# Patient Record
Sex: Female | Born: 1940
Health system: Southern US, Community
[De-identification: ages and names within clinical notes are randomized; demographics above are authoritative.]

## PROBLEM LIST (undated history)

## (undated) DIAGNOSIS — F3289 Other specified depressive episodes: Secondary | ICD-10-CM

## (undated) DIAGNOSIS — E119 Type 2 diabetes mellitus without complications: Secondary | ICD-10-CM

## (undated) DIAGNOSIS — I313 Pericardial effusion (noninflammatory): Principal | ICD-10-CM

## (undated) DIAGNOSIS — F329 Major depressive disorder, single episode, unspecified: Secondary | ICD-10-CM

## (undated) DIAGNOSIS — E785 Hyperlipidemia, unspecified: Secondary | ICD-10-CM

## (undated) DIAGNOSIS — I10 Essential (primary) hypertension: Secondary | ICD-10-CM

## (undated) DIAGNOSIS — E669 Obesity, unspecified: Secondary | ICD-10-CM

## (undated) HISTORY — DX: Essential (primary) hypertension: I10

## (undated) HISTORY — DX: Other specified depressive episodes: F32.89

## (undated) HISTORY — PX: BREAST BIOPSY: SHX20

## (undated) HISTORY — DX: Type 2 diabetes mellitus without complications: E11.9

## (undated) HISTORY — DX: Obesity, unspecified: E66.9

## (undated) HISTORY — DX: Major depressive disorder, single episode, unspecified: F32.9

## (undated) HISTORY — DX: Pericardial effusion (noninflammatory): I31.3

## (undated) HISTORY — DX: Hyperlipidemia, unspecified: E78.5

## (undated) HISTORY — PX: KNEE ARTHROSCOPY: SUR90

## (undated) HISTORY — PX: ABDOMINAL HYSTERECTOMY: SHX81

---

## 1999-11-02 ENCOUNTER — Encounter: Admission: RE | Admit: 1999-11-02 | Discharge: 1999-11-02 | Payer: Self-pay | Admitting: *Deleted

## 2000-11-02 ENCOUNTER — Encounter: Admission: RE | Admit: 2000-11-02 | Discharge: 2000-11-02 | Payer: Self-pay | Admitting: *Deleted

## 2001-11-04 ENCOUNTER — Encounter: Admission: RE | Admit: 2001-11-04 | Discharge: 2001-11-04 | Payer: Self-pay | Admitting: *Deleted

## 2002-06-25 ENCOUNTER — Encounter: Admission: RE | Admit: 2002-06-25 | Discharge: 2002-07-22 | Payer: Self-pay | Admitting: Internal Medicine

## 2002-08-04 ENCOUNTER — Encounter: Admission: RE | Admit: 2002-08-04 | Discharge: 2002-11-02 | Payer: Self-pay | Admitting: Internal Medicine

## 2002-11-05 ENCOUNTER — Encounter: Payer: Self-pay | Admitting: Internal Medicine

## 2002-11-05 ENCOUNTER — Encounter: Admission: RE | Admit: 2002-11-05 | Discharge: 2002-11-05 | Payer: Self-pay | Admitting: Internal Medicine

## 2003-11-09 ENCOUNTER — Encounter: Admission: RE | Admit: 2003-11-09 | Discharge: 2003-11-09 | Payer: Self-pay | Admitting: Internal Medicine

## 2004-11-11 ENCOUNTER — Encounter: Admission: RE | Admit: 2004-11-11 | Discharge: 2004-11-11 | Payer: Self-pay | Admitting: Internal Medicine

## 2004-11-29 ENCOUNTER — Encounter: Admission: RE | Admit: 2004-11-29 | Discharge: 2004-11-29 | Payer: Self-pay | Admitting: Internal Medicine

## 2004-11-30 ENCOUNTER — Encounter: Admission: RE | Admit: 2004-11-30 | Discharge: 2004-11-30 | Payer: Self-pay | Admitting: Internal Medicine

## 2004-11-30 ENCOUNTER — Encounter (INDEPENDENT_AMBULATORY_CARE_PROVIDER_SITE_OTHER): Payer: Self-pay | Admitting: *Deleted

## 2005-05-26 ENCOUNTER — Encounter: Admission: RE | Admit: 2005-05-26 | Discharge: 2005-05-26 | Payer: Self-pay | Admitting: Internal Medicine

## 2005-11-14 ENCOUNTER — Encounter: Admission: RE | Admit: 2005-11-14 | Discharge: 2005-11-14 | Payer: Self-pay | Admitting: Internal Medicine

## 2006-11-15 ENCOUNTER — Encounter: Admission: RE | Admit: 2006-11-15 | Discharge: 2006-11-15 | Payer: Self-pay | Admitting: Internal Medicine

## 2007-11-18 ENCOUNTER — Encounter: Admission: RE | Admit: 2007-11-18 | Discharge: 2007-11-18 | Payer: Self-pay | Admitting: Internal Medicine

## 2008-09-01 ENCOUNTER — Encounter: Admission: RE | Admit: 2008-09-01 | Discharge: 2008-09-01 | Payer: Self-pay | Admitting: Internal Medicine

## 2008-10-09 ENCOUNTER — Inpatient Hospital Stay (HOSPITAL_COMMUNITY): Admission: EM | Admit: 2008-10-09 | Discharge: 2008-10-10 | Payer: Self-pay | Admitting: Emergency Medicine

## 2008-10-12 ENCOUNTER — Observation Stay (HOSPITAL_COMMUNITY): Admission: EM | Admit: 2008-10-12 | Discharge: 2008-10-14 | Payer: Self-pay | Admitting: Emergency Medicine

## 2008-10-13 ENCOUNTER — Ambulatory Visit: Payer: Self-pay | Admitting: Surgery

## 2008-10-13 ENCOUNTER — Encounter (INDEPENDENT_AMBULATORY_CARE_PROVIDER_SITE_OTHER): Payer: Self-pay | Admitting: Internal Medicine

## 2008-11-18 ENCOUNTER — Encounter: Admission: RE | Admit: 2008-11-18 | Discharge: 2008-11-18 | Payer: Self-pay | Admitting: Internal Medicine

## 2008-11-18 ENCOUNTER — Ambulatory Visit: Payer: Self-pay | Admitting: Vascular Surgery

## 2009-02-14 IMAGING — MG MM SCREEN MAMMOGRAM BILATERAL
4 series · 4 of 4 positions shown · non-contrast
Comparison: none

DG SCREEN MAMMOGRAM BILATERAL
Bilateral CC and MLO view(s) were taken.

DIGITAL SCREENING MAMMOGRAM WITH CAD:
There are scattered fibroglandular densities.  No masses or malignant type calcifications are 
identified.  Compared with prior studies.

[R CC]
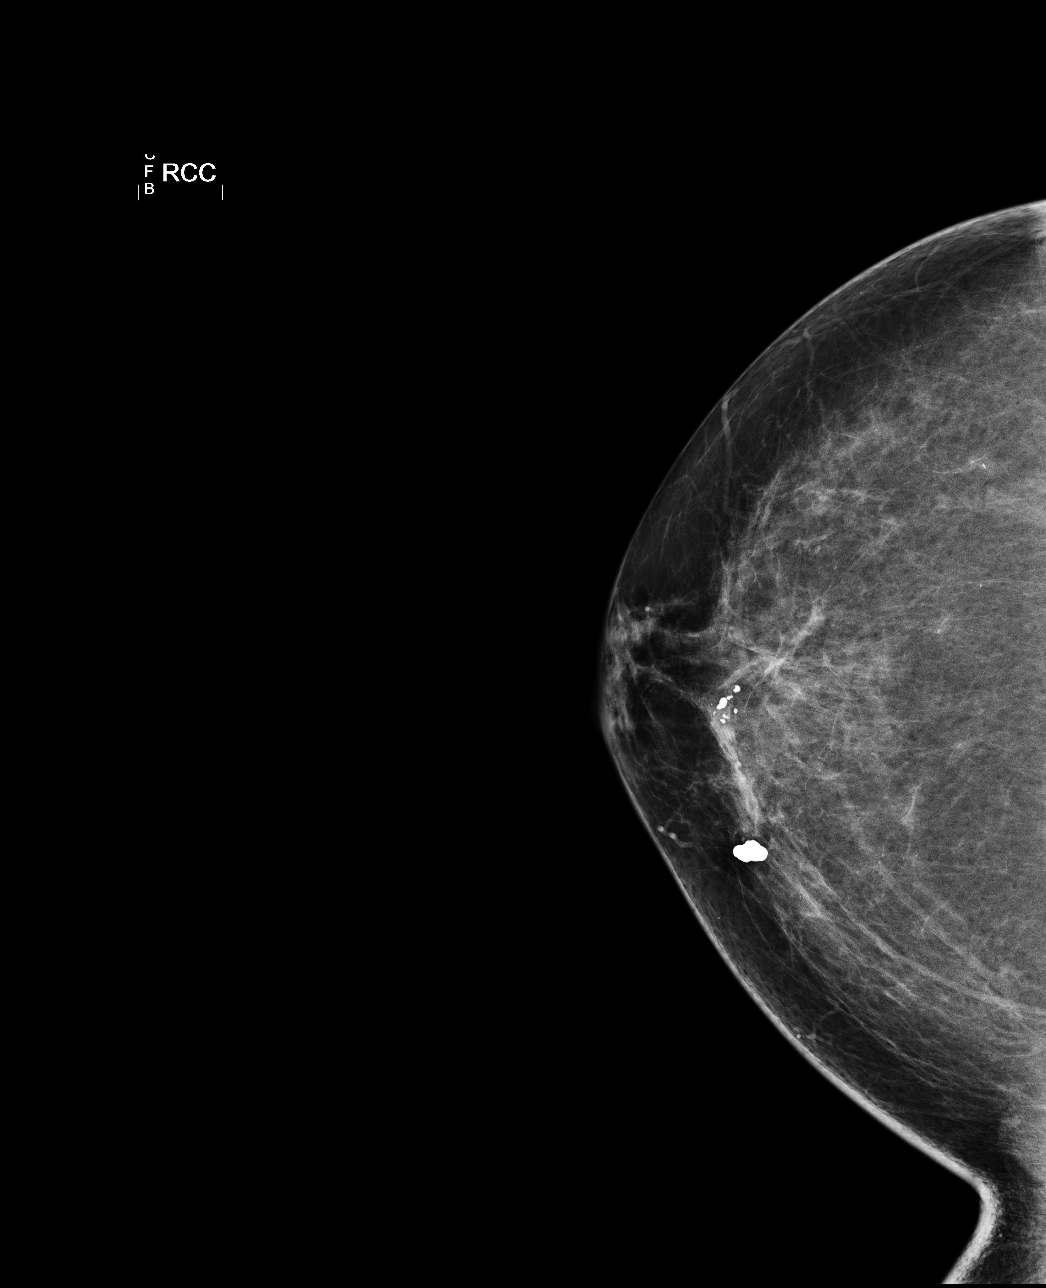

[L CC]
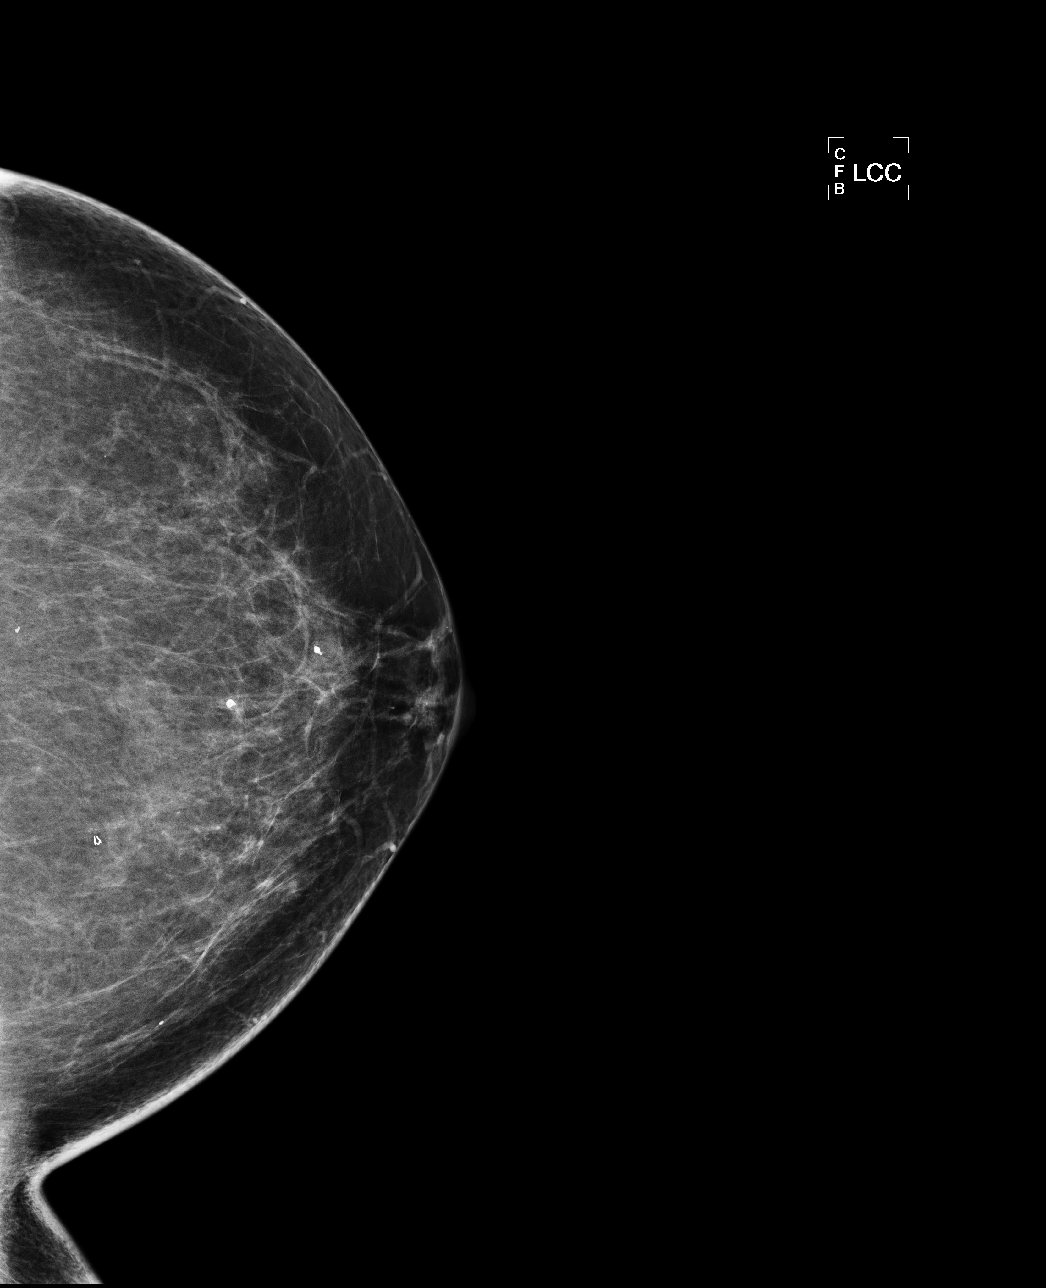

[L MLO]
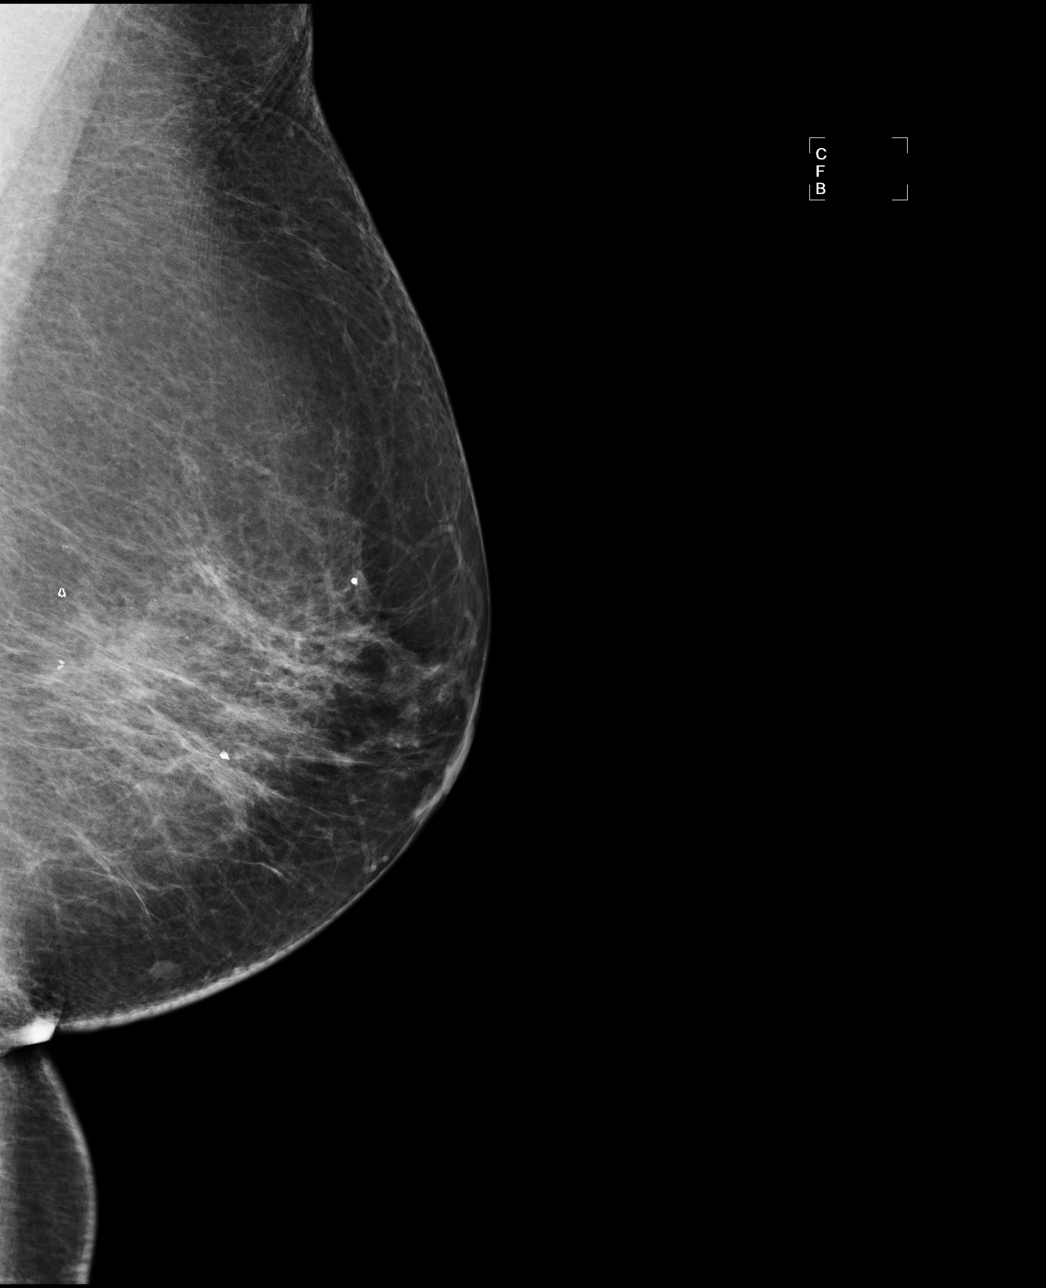

[R MLO]
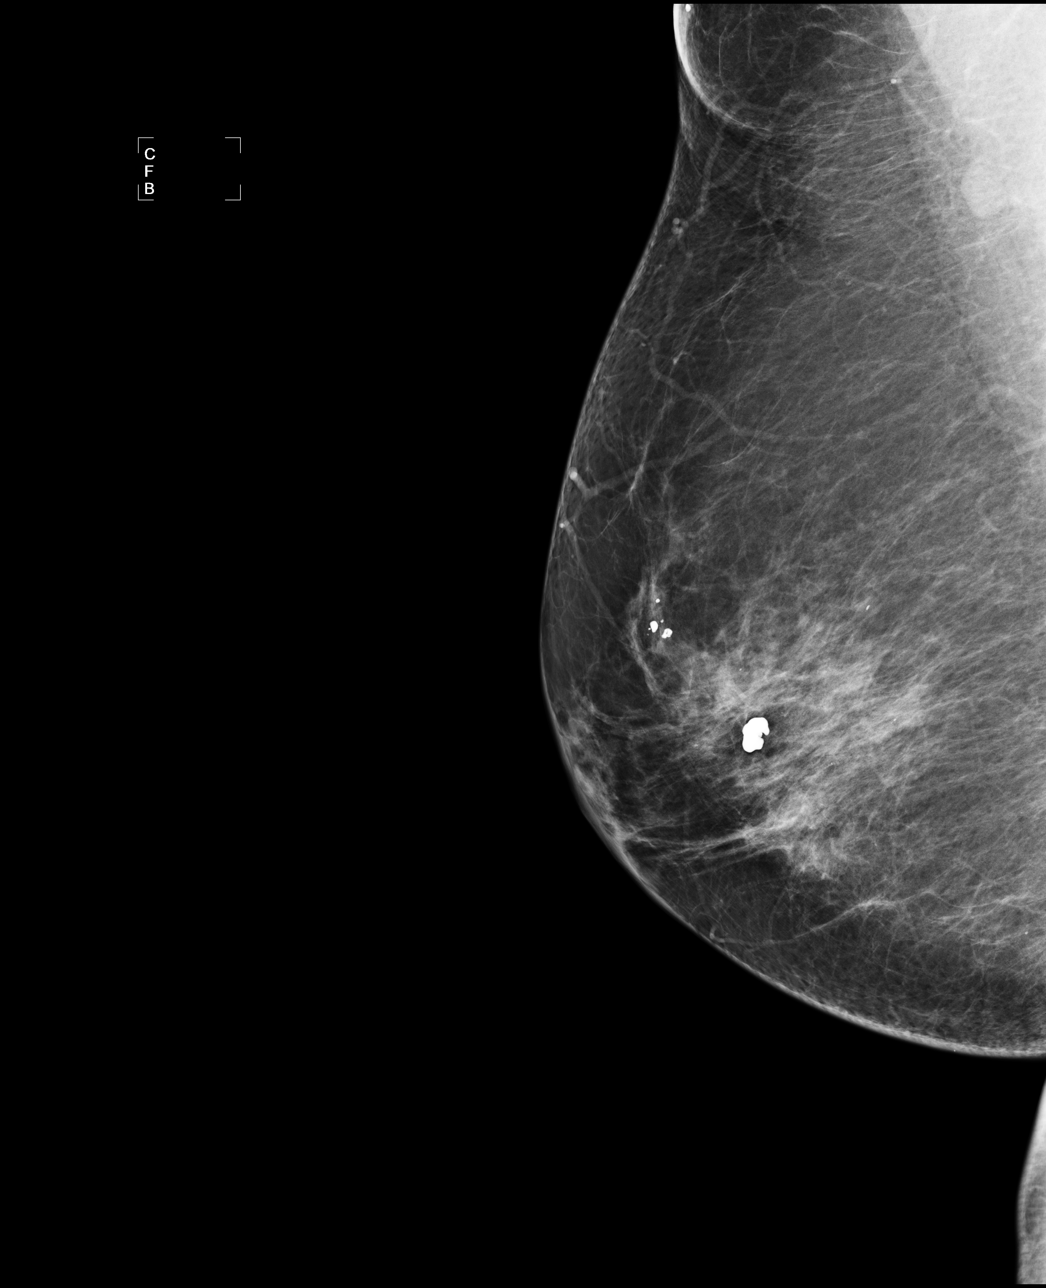

[4 of 4 positions shown; findings below may reference images not displayed]

IMPRESSION: No specific mammographic evidence of malignancy.  Next screening mammogram is recommended in one 
year.

ASSESSMENT: Negative - BI-RADS 1

Screening mammogram in 1 year.
ANALYZED BY COMPUTER AIDED DETECTION. , THIS PROCEDURE WAS A DIGITAL MAMMOGRAM.

## 2009-05-19 ENCOUNTER — Ambulatory Visit: Payer: Self-pay | Admitting: Vascular Surgery

## 2009-11-10 ENCOUNTER — Ambulatory Visit: Payer: Self-pay | Admitting: Vascular Surgery

## 2009-11-22 ENCOUNTER — Encounter: Admission: RE | Admit: 2009-11-22 | Discharge: 2009-11-22 | Payer: Self-pay | Admitting: Internal Medicine

## 2010-10-29 ENCOUNTER — Other Ambulatory Visit: Payer: Self-pay | Admitting: Internal Medicine

## 2010-10-29 DIAGNOSIS — Z1239 Encounter for other screening for malignant neoplasm of breast: Secondary | ICD-10-CM

## 2010-11-10 ENCOUNTER — Ambulatory Visit: Admit: 2010-11-10 | Payer: Self-pay | Admitting: Vascular Surgery

## 2010-11-10 ENCOUNTER — Ambulatory Visit (INDEPENDENT_AMBULATORY_CARE_PROVIDER_SITE_OTHER): Payer: BC Managed Care – PPO | Admitting: Vascular Surgery

## 2010-11-10 ENCOUNTER — Other Ambulatory Visit (INDEPENDENT_AMBULATORY_CARE_PROVIDER_SITE_OTHER): Payer: Medicare Other

## 2010-11-10 DIAGNOSIS — I6529 Occlusion and stenosis of unspecified carotid artery: Secondary | ICD-10-CM

## 2010-11-18 NOTE — Procedures (Unsigned)
CAROTID DUPLEX EXAM  INDICATION:  Carotid stenosis.  HISTORY: Diabetes:  yes Cardiac:  no Hypertension:  yes Smoking:  no Previous Surgery:  no CV History:  Asymptomatic Amaurosis Fugax No, Paresthesias No, Hemiparesis No                                      RIGHT             LEFT Brachial systolic pressure:         133               132 Brachial Doppler waveforms:         normal            normal Vertebral direction of flow:        Antegrade         Antegrade DUPLEX VELOCITIES (cm/sec) CCA peak systolic                   70                69 ECA peak systolic                   104               66 ICA peak systolic                   89                69 ICA end diastolic                   34                28 PLAQUE MORPHOLOGY:                  homogenous        mixed PLAQUE AMOUNT:                      minimal           minimal PLAQUE LOCATION:                    bifurcation       ICA, bifurcation  IMPRESSION: 1. Bilateral internal carotid artery velocities suggest 1% to 39%     stenosis. 2. Antegrade vertebral arteries bilaterally.        ___________________________________________ Janetta Hora Darrick Penna, MD  EM/MEDQ  D:  11/10/2010  T:  11/10/2010  Job:  [id number]

## 2010-11-21 NOTE — Assessment & Plan Note (Signed)
OFFICE VISIT  Evelyn, Davis DOB:  1941/08/07                                       11/10/2010 [id number]#:[id number]  HISTORY OF PRESENT ILLNESS:  The patient is a 70 year old female who returns today for followup for carotid occlusive disease.  She was last seen in February of 2010.  She denies any symptoms of TIA, amaurosis or stroke currently.  Her symptoms previously were syncope related.  She has had no further syncope or presyncope type episodes.  CHRONIC MEDICAL PROBLEMS:  Continue to remain hypertension and diabetes. These are both currently controlled and followed by Dr. Nehemiah Settle.  REVIEW OF SYSTEMS:  She denies any shortness of breath or chest pain.  PHYSICAL EXAM:  Vital signs:  Blood pressure is 134/83 in the right arm, 119/77 in the left arm, heart rate 80 and regular, oxygen saturation is 97% on room air.  HEENT:  Unremarkable.  Neck:  Has 2+ carotid pulses without bruit.  Chest:  Clear to auscultation.  Cardiac:  Regular rate and rhythm without murmur.  Upper extremities:  She has 2+ radial pulses bilaterally.  Neurological:  She has symmetric upper extremity and lower extremity motor strength which is 5/5.  She had a carotid duplex exam today which showed less than 40% stenosis bilaterally.  She had antegrade vertebral flow.  I believe the best option at this point would be continued aspirin antiplatelet therapy. She will have a followup carotid duplex in one year's time.  If her stenosis at that time is still less than 40% bilaterally we may consider spreading out her surveillance time.    [name], MD Electronically Signed  CEF/MEDQ  D:  11/10/2010  T:  11/11/2010  Job:  [id number]  cc:   Deirdre Peer. Polite, M.D.

## 2010-11-23 ENCOUNTER — Ambulatory Visit
Admission: RE | Admit: 2010-11-23 | Discharge: 2010-11-23 | Disposition: A | Discharge status: Home / Self Care | Payer: BC Managed Care – PPO | Source: Ambulatory Visit | Attending: Internal Medicine | Admitting: Internal Medicine

## 2010-11-23 DIAGNOSIS — Z1239 Encounter for other screening for malignant neoplasm of breast: Secondary | ICD-10-CM

## 2011-01-23 LAB — DIFFERENTIAL
Basophils Absolute: 0 10*3/uL (ref 0.0–0.1)
Basophils Relative: 1 % (ref 0–1)
Eosinophils Absolute: 0.1 10*3/uL (ref 0.0–0.7)
Eosinophils Relative: 1 % (ref 0–5)
Lymphocytes Relative: 37 % (ref 12–46)
Lymphs Abs: 2 10*3/uL (ref 0.7–4.0)
Monocytes Absolute: 0.4 10*3/uL (ref 0.1–1.0)
Neutro Abs: 2.8 10*3/uL (ref 1.7–7.7)
Neutrophils Relative %: 75 % (ref 43–77)

## 2011-01-23 LAB — COMPREHENSIVE METABOLIC PANEL
Albumin: 3.3 g/dL — ABNORMAL LOW (ref 3.5–5.2)
Alkaline Phosphatase: 55 U/L (ref 39–117)
Alkaline Phosphatase: 57 U/L (ref 39–117)
BUN: 7 mg/dL (ref 6–23)
BUN: 8 mg/dL (ref 6–23)
BUN: 9 mg/dL (ref 6–23)
Calcium: 8.6 mg/dL (ref 8.4–10.5)
Calcium: 8.7 mg/dL (ref 8.4–10.5)
Calcium: 9 mg/dL (ref 8.4–10.5)
Creatinine, Ser: 0.54 mg/dL (ref 0.4–1.2)
GFR calc non Af Amer: >60 mL/min (ref >60)
Glucose, Bld: 147 mg/dL — ABNORMAL HIGH (ref 70–99)
Glucose, Bld: 253 mg/dL — ABNORMAL HIGH (ref 70–99)
Glucose, Bld: 266 mg/dL — ABNORMAL HIGH (ref 70–99)
Potassium: 3.4 mEq/L — ABNORMAL LOW (ref 3.5–5.1)
Potassium: 4.3 mEq/L (ref 3.5–5.1)
Sodium: 133 mEq/L — ABNORMAL LOW (ref 135–145)
Total Protein: 6.6 g/dL (ref 6.0–8.3)
Total Protein: 6.7 g/dL (ref 6.0–8.3)
Total Protein: 7.5 g/dL (ref 6.0–8.3)

## 2011-01-23 LAB — BASIC METABOLIC PANEL
BUN: 10 mg/dL (ref 6–23)
CO2: 24 mEq/L (ref 19–32)
Chloride: 86 mEq/L — ABNORMAL LOW (ref 96–112)
Creatinine, Ser: 0.66 mg/dL (ref 0.4–1.2)
Glucose, Bld: 251 mg/dL — ABNORMAL HIGH (ref 70–99)
Potassium: 3.8 mEq/L (ref 3.5–5.1)

## 2011-01-23 LAB — URINALYSIS, ROUTINE W REFLEX MICROSCOPIC
Bilirubin Urine: NEGATIVE
Bilirubin Urine: NEGATIVE
Glucose, UA: 250 mg/dL — AB
Hgb urine dipstick: NEGATIVE
Ketones, ur: 40 mg/dL — AB
Leukocytes, UA: NEGATIVE
Nitrite: NEGATIVE
Protein, ur: NEGATIVE mg/dL
Protein, ur: NEGATIVE mg/dL
Specific Gravity, Urine: 1.021 (ref 1.005–1.030)
Urobilinogen, UA: 2 mg/dL — ABNORMAL HIGH (ref 0.0–1.0)
pH: 6.5 (ref 5.0–8.0)

## 2011-01-23 LAB — CBC
HCT: 37.7 % (ref 36.0–46.0)
HCT: 38.3 % (ref 36.0–46.0)
HCT: 39.6 % (ref 36.0–46.0)
Hemoglobin: 12.7 g/dL (ref 12.0–15.0)
Hemoglobin: 12.8 g/dL (ref 12.0–15.0)
MCHC: 32.3 g/dL (ref 30.0–36.0)
MCHC: 33.2 g/dL (ref 30.0–36.0)
MCV: 78.8 fL (ref 78.0–100.0)
MCV: 79.4 fL (ref 78.0–100.0)
Platelets: 197 10*3/uL (ref 150–400)
RDW: 13.8 % (ref 11.5–15.5)
RDW: 14 % (ref 11.5–15.5)
RDW: 14.1 % (ref 11.5–15.5)
WBC: 5.3 10*3/uL (ref 4.0–10.5)

## 2011-01-23 LAB — FOLATE: Folate: >20 ng/mL

## 2011-01-23 LAB — GLUCOSE, CAPILLARY
Glucose-Capillary: 149 mg/dL — ABNORMAL HIGH (ref 70–99)
Glucose-Capillary: 149 mg/dL — ABNORMAL HIGH (ref 70–99)
Glucose-Capillary: 173 mg/dL — ABNORMAL HIGH (ref 70–99)
Glucose-Capillary: 210 mg/dL — ABNORMAL HIGH (ref 70–99)
Glucose-Capillary: 212 mg/dL — ABNORMAL HIGH (ref 70–99)
Glucose-Capillary: 232 mg/dL — ABNORMAL HIGH (ref 70–99)
Glucose-Capillary: 232 mg/dL — ABNORMAL HIGH (ref 70–99)
Glucose-Capillary: 265 mg/dL — ABNORMAL HIGH (ref 70–99)
Glucose-Capillary: 283 mg/dL — ABNORMAL HIGH (ref 70–99)

## 2011-01-23 LAB — POCT I-STAT, CHEM 8
Chloride: 95 mEq/L — ABNORMAL LOW (ref 96–112)
HCT: 40 % (ref 36.0–46.0)
Hemoglobin: 13.6 g/dL (ref 12.0–15.0)
Potassium: 3.6 mEq/L (ref 3.5–5.1)
Sodium: 133 mEq/L — ABNORMAL LOW (ref 135–145)

## 2011-01-23 LAB — PROTIME-INR
INR: 1 (ref 0.00–1.49)
Prothrombin Time: 13.4 seconds (ref 11.6–15.2)
Prothrombin Time: 13.5 seconds (ref 11.6–15.2)

## 2011-01-23 LAB — ANA: Anti Nuclear Antibody(ANA): NEGATIVE

## 2011-01-23 LAB — URINE CULTURE

## 2011-01-23 LAB — HEMOGLOBIN A1C: Hgb A1c MFr Bld: 9.1 % — ABNORMAL HIGH (ref 4.6–6.1)

## 2011-01-23 LAB — CARDIAC PANEL(CRET KIN+CKTOT+MB+TROPI)
CK, MB: 1 ng/mL (ref 0.3–4.0)
Relative Index: INVALID (ref 0.0–2.5)
Relative Index: INVALID (ref 0.0–2.5)
Troponin I: 0.02 ng/mL (ref 0.00–0.06)
Troponin I: <0.01 ng/mL (ref 0.00–0.06)

## 2011-01-23 LAB — LIPID PANEL
Cholesterol: 177 mg/dL (ref 0–200)
LDL Cholesterol: 128 mg/dL — ABNORMAL HIGH (ref 0–99)

## 2011-01-23 LAB — D-DIMER, QUANTITATIVE: D-Dimer, Quant: 0.76 ug/mL-FEU — ABNORMAL HIGH (ref 0.00–0.48)

## 2011-01-23 LAB — CK TOTAL AND CKMB (NOT AT ARMC)
Relative Index: INVALID (ref 0.0–2.5)
Total CK: 89 U/L (ref 7–177)

## 2011-01-23 LAB — POCT CARDIAC MARKERS
CKMB, poc: <1 ng/mL — ABNORMAL LOW (ref 1.0–8.0)
CKMB, poc: <1 ng/mL — ABNORMAL LOW (ref 1.0–8.0)
Myoglobin, poc: 32.4 ng/mL (ref 12–200)
Troponin i, poc: 0.07 ng/mL (ref 0.00–0.09)

## 2011-01-23 LAB — HOMOCYSTEINE: Homocysteine: 5.3 umol/L (ref 4.0–15.4)

## 2011-01-23 LAB — URINE MICROSCOPIC-ADD ON

## 2011-01-23 LAB — BRAIN NATRIURETIC PEPTIDE
Pro B Natriuretic peptide (BNP): <30 pg/mL (ref 0.0–100.0)
Pro B Natriuretic peptide (BNP): <30 pg/mL (ref 0.0–100.0)

## 2011-01-23 LAB — SEDIMENTATION RATE: Sed Rate: 20 mm/hr (ref 0–22)

## 2011-01-23 LAB — TROPONIN I: Troponin I: 0.02 ng/mL (ref 0.00–0.06)

## 2011-02-21 NOTE — Discharge Summary (Signed)
NAMELYNCOLN, MASKELL               ACCOUNT NO.:  [id number]   MEDICAL RECORD NO.:  [id number]          PATIENT TYPE:  INP   LOCATION:  3738                         FACILITY:  [facility]   PHYSICIAN:  Theressa Millard, M.D.    DATE OF BIRTH:  1941/06/02   DATE OF ADMISSION:  10/09/2008  DATE OF DISCHARGE:  10/10/2008                               DISCHARGE SUMMARY   ADMITTING DIAGNOSIS:  Syncopal episode.   DISCHARGE DIAGNOSES:  1. Orthostatic syncope secondary to dehydration from acute bronchitis.  2. Acute bronchitis.  3. Diabetes, poorly controlled with hemoglobin A1c of 9.1.  4. Hypertension.  5. Hyponatremia.   The patient is a 70 year old woman who was admitted with an orthostatic  fall and loss of consciousness as she was using the bathroom.  She had  recently been started on Bactrim for a bronchitis.   HOSPITAL COURSE:  The patient was admitted, given IV fluids.  It was  noted that her hemoglobin A1c was 9.1.  With IV fluids, the patient felt  much better, had actually no orthostatic lightheadedness and was able to  ambulate without difficulty or thought of syncope.  Therefore, she was  discharged in improved condition.  She is to resume her Bactrim and  complete that course, hold hydrochlorothiazide from the usual medicines,  and her continuing medicines will be metformin 1000 mg 2 times a day,  ramipril 2.5 mg daily, glipizide 10 mg daily, and Bactrim as indicated  above.  She may use Mucinex 600 mg b.i.d. for the cough.   ACTIVITIES:  As tolerated.   DIET:  No added salt.   FOLLOWUP:  See Dr. Nehemiah Settle in 2-3 weeks.      Theressa Millard, M.D.  Electronically Signed     JO/MEDQ  D:  10/11/2008  T:  10/12/2008  Job:  [id number]

## 2011-02-21 NOTE — Procedures (Signed)
CAROTID DUPLEX EXAM   INDICATION:  Followup carotid artery disease.   HISTORY:  Diabetes:  Yes.  Cardiac:  No.  Hypertension:  Yes.  Smoking:  No.  Previous Surgery:  No.  CV History:  Intermittent vertigo.  Amaurosis Fugax No, Paresthesias No, Hemiparesis No                                       RIGHT             LEFT  Brachial systolic pressure:         128               130  Brachial Doppler waveforms:         WNL               WNL  Vertebral direction of flow:        Antegrade         Antegrade  DUPLEX VELOCITIES (cm/sec)  CCA peak systolic                   81                79  ECA peak systolic                   117               94  ICA peak systolic                   P=60, D=119       P=56, D=122  ICA end diastolic                   P=20, D=41        P=19, D=40  PLAQUE MORPHOLOGY:                  Homogenous        Mixed  PLAQUE AMOUNT:                      Mild              Mild  PLAQUE LOCATION:                    ICA/bifurcation   ICA/bifurcation   IMPRESSION:  1. Bilateral ICA velocities are stable from previous study done at      hospital and are suggestive of 40-59% stenosis.  This is a lower      category on the right than previous due to difference in criteria.  2. Elevated velocities are only noted distally with very minimal      plaque noted in the carotid arteries.        ___________________________________________  Janetta Hora Fields, MD   AS/MEDQ  D:  05/19/2009  T:  05/19/2009  Job:  [id number]

## 2011-02-21 NOTE — H&P (Signed)
NAMEALANNY, Evelyn Davis               ACCOUNT NO.:  [id number]   MEDICAL RECORD NO.:  [id number]          PATIENT TYPE:  OBV   LOCATION:  3705                         FACILITY:  [facility]   PHYSICIAN:  Corinna L. Lendell Caprice, MDDATE OF BIRTH:  08/28/1941   DATE OF ADMISSION:  10/12/2008  DATE OF DISCHARGE:                              HISTORY & PHYSICAL   CHIEF COMPLAINT:  Weakness.   HISTORY OF PRESENT ILLNESS:  [name] is a 70 year old black female who  was discharged from the hospital 2 days ago and returns today with  sudden onset of weakness, vertigo, and nausea.  She was hospitalized for  about a day on January 1 and apparently had a syncopal episode and hit  the right side of her forehead at that time.  It was unwitnessed.  She  had loss of consciousness.  Apparently, she was in the bathroom and had  just stood up from the commode.  She was given IV fluids and stroke  workup was done which was negative.  She had a CT of the brain which was  negative.  She had an MRI and MRA which showed a lot of motion artifact,  but no definite abnormality.  She was brought to the emergency room  today with sudden onset weakness.  She also had vertigo and nausea.  She  seemed sleepier than usual according to her family members.  Most of the  family is per e-chart and family members as the patient can provide  minimal history.  There is no focal weakness.  The patient's daughter  reports that she and her father had tried to get the patient out of bed  to drive her to the emergency room.  They were unable and so EMS was  called.  The patient reports that she has had fevers on and off for  several weeks.  She was recently diagnosed with what sounds like acute  sinusitis and started on Bactrim.  She reports that most of her symptoms  started when the Bactrim was started.  She has had no shortness of  breath.  She has had no rash.  She has had no myalgias or arthralgias.  Her Bactrim had been held and  then resumed again yesterday.  The family  is questioning whether Bactrim can be causing most of these symptoms.  The daughter also reports that her right periorbital area had ecchymosis  starting today.  She has not fallen again.  She is on no sedating  medications.  During her last hospitalization, the syncope was felt to  be secondary to orthostasis and dehydration from acute bronchitis.  She  also was noted to have poorly controlled diabetes with a hemoglobin A1c  of 9.1.  The patient is complaining of photophobia.  She received Zofran  this morning and subsequently meclizine.  Currently, she has no vertigo,  but she is sleepy.  She has no change in vision.  She has no history of  migraines or similar episodes.  She reports occasionally having tinnitus  but will not qualify this further.  She had a normal TSH  during her last  hospitalization.  MRA of the head was unremarkable, again, there was  motion artifact.   PAST MEDICAL HISTORY:  1. Poorly controlled diabetes.  2. Hypertension.  3. Acute bronchitis/sinusitis recently.   MEDICATIONS:  1. Her hydrochlorothiazide has been held.  2. Metformin 1000 mg twice a day.  3. Ramipril 2.5 mg a day.  4. Glipizide 10 mg a day.  5. Bactrim twice a day.   SOCIAL HISTORY:  Reviewed and as per previous.   FAMILY HISTORY:  Noncontributory.   REVIEW OF SYSTEMS:  As above, otherwise difficult to obtain as the  patient will provide minimal history.   PHYSICAL EXAMINATION:  VITAL SIGNS:  Temperature is 97.9, blood pressure  153/86, pulse 74, respiratory rate 16, and oxygen saturation 99% on room  air.  GENERAL:  The patient is groggy black female with a cold compress over  her brow.  HEENT:  She has a healing laceration over her right brow and periorbital  swelling and ecchymosis on the right.  She has some tenderness over her  right brow.  Pupils are equal, round, and reactive to light.  Sclerae  nonicteric.  Extraocular movements are  intact and pain with eye  movement.  She has slightly injected conjunctivae.  She has moist mucous  membranes.  Shoddy submandibular lymphadenopathy.  Oropharynx is without  erythema or exudate.  Tympanic membranes are without erythema or blood,  no bulging.  NECK:  Supple.  No carotid bruits.  LUNGS:  Clear to auscultation bilaterally without wheezes, rhonchi, or  rales.  CARDIOVASCULAR:  Regular rate and rhythm without murmurs, gallops, or  rubs.  ABDOMEN:  Normal bowel sounds.  Soft, nontender, and nondistended.  GU AND RECTAL:  Deferred.  EXTREMITIES:  No clubbing, cyanosis, or edema.  Pulses intact.  MUSCULOSKELETAL:  No joint effusions.  No tenderness.  NEUROLOGIC:  The patient is groggy, oriented.  She has paucity of  speech.  Cranial nerves are intact.  Motor strength 5/5 throughout.  Gait was not tested.  Finger-to-nose normal.  Deep tendon reflexes are  symmetric.  Babinski's negative.  SKIN:  No rash.  PSYCHIATRIC:  The patient is cooperative, flat affect.   LABORATORY DATA:  Hemoglobin is 13 and hematocrit 40.  No platelet count  or white count was done.  INR is 1.0.  D-dimer 0.76.  Complete metabolic  panel significant for a sodium of 133, on January 1, it was 122.  Chloride is 95, glucose is 250, and BUN and creatinine are normal.  LFTs  are essentially unremarkable.  Two sets of point-of-care enzymes were  negative.  During the last hospitalization, she had a hemoglobin A1c  that was 9.1.  Negative BNP.  She had an LDL that was 128, HDL that was  28, and triglycerides 107.  She had a TSH of 1.046, vitamin B12 of 1334,  folate greater than 20, and cortisol 14, and homocysteine 5.3.  Urinalysis today shows 250 glucose, specific gravity 1.021, negative  bilirubin, 15 ketones, negative blood, negative protein, negative  nitrite, and negative leukocyte esterase.  Chest x-ray 2-view shows  interval improvement in the retrocardiac density.  CT angiogram of the  chest shows  borderline mediastinal and hilar lymphadenopathy of  indeterminate etiology, a 4-mm left lower lobe pulmonary nodule.  If the  patient had a high risk for bronchogenic carcinoma, follow up CT at 1  year is recommended.   ASSESSMENT AND PLAN:  1. Sudden onset of weakness, vertigo, nausea with  altered mental      status, and several weeks of headaches:  She apparently had quite a      fall 3 days ago and I will repeat CAT scan of the brain today stat      to rule out subdural hematoma or other.  Also, consider drug-      induced lupus or other reaction to Bactrim:  I will order a total      CPK, ANA, erythrocyte sedimentation rate, and antihistone      antibodies and stop the Bactrim.  She will get IV fluids and      antiemetics.  The meclizine seems to have helped her symptoms and I      will order that p.r.n.  She will also get Zofran as needed and      Protonix.  She will have sequential compression devices placed      pending repeat CAT scan.  She will be placed on 23-hour      observation.  Apparently, during the last episode, she improved      quite quickly and hopefully that will be the case again today.  2. Recent syncope with closed head injury, see above.  3. Uncontrolled diabetes:  I will change glipizide to 10 mg p.o.      b.i.d. and hold the metformin for now given her symptoms and the      recent IV contrast.  4. Hypertension:  Her hydrochlorothiazide has been held.  5. A 4-mm indeterminate lung nodule.  6. Recent sinusitis and bronchitis:  Certainly, she may be having a      labyrinthitis.  If symptoms continue, she may need repeat MRI with      sedation as the previous MRI showed a lot of motion artifact, in      order to rule out posterior circulation stroke.  In addition, I      will order carotid Dopplers to better evaluate posterior      circulation.  She will also get sliding scale insulin.  1. Indeterminate lung nodule.  She will need repeat CAT scan in a       year.      Corinna L. Lendell Caprice, MD  Electronically Signed     CLS/MEDQ  D:  10/12/2008  T:  10/13/2008  Job:  [id number]   cc:   Deirdre Peer. Polite, M.D.

## 2011-02-21 NOTE — Assessment & Plan Note (Signed)
OFFICE VISIT   Evelyn Davis, Evelyn Davis  DOB:  Jan 08, 1941                                       11/18/2008  MWNUU#:[id number]   The patient is a 70 year old female who recently was admitted for  syncope.  At the time she had an episode of bronchitis and was found to  be orthostatic.  Her workup for this syncopal episode included a CT scan  of the head, an MRI of the brain and a carotid duplex scan.  She was  found to have no evidence of acute stroke.  Her carotid duplex exam  showed a 60% to 80% right internal carotid artery stenosis and a 40% to  60% left internal carotid artery stenosis with antegrade vertebral flow.  This is a study dated October 13, 2008.   The patient denies any prior symptoms of TIA, amaurosis or stroke.  She  has had problems with her right hand for approximately 30 years and said  it is very clumsy and tends to drop at the wrist.  She thinks that this  was related to trauma at work 30 years ago.  She has had no acute or new  problems.  During her syncopal episode, she was not noted to have any  seizure symptoms.  She was not noticed to have any focal neurologic  deficit.   Her atherosclerotic risk factors include hypertension and diabetes.  She  denies elevated cholesterol.  She has no history of myocardial  infarction.  She denies any symptoms currently of TIA, amaurosis or  stroke.   PAST MEDICAL HISTORY:  Is otherwise fairly unremarkable.   MEDICATIONS:  1. Metformin 1000 mg two a day.  2. Hydrochlorothiazide 12.5 mg once a day.  3. Ramipril 2.5 mg once a day.  4. Glipizide 10 mg once a day.  5. Lantus insulin 14 units once a day.  6. Lexapro 10 mg once a day.  7. Aspirin 81 mg once a day.   ALLERGIES:  She has no known drug allergies but she states that Dr.  Nehemiah Settle was concerned that she may have had some sort of reaction to  sulfa during her syncopal episode workup.  She did not state whether or  not this was a rash or  anaphylactic type or Stevens-Johnson type  symptom.   FAMILY HISTORY:  Unremarkable.   SOCIAL HISTORY:  She is married, has 3 children.  She is a nonsmoker,  non-consumer of alcohol.   REVIEW OF SYSTEMS:  She is 5 feet 5 inches, 194 pounds.  CARDIAC/PULMONARY/GI/RENAL/VASCULAR/NEUROLOGIC/ORTHOPEDIC/PSYCHIATRIC/EN  T/HEMATOLOGIC:  Are all negative.   PHYSICAL EXAMINATION:  Vital Signs:  Blood pressure is 133/82 in the  left arm, 156/80 in the right arm, pulse is 76 and regular.  HEENT:  Unremarkable.  She has 2+ carotid pulses bilaterally.  Chest:  Clear to  auscultation.  Cardiac:  Regular rate and rhythm. without murmur.  She  has 2+ brachial and radial pulses bilaterally.  Abdomen:  Soft,  nontender, nondistended with no masses.  Extremities:  She has 2+  femoral, 1+ popliteal and 1+ dorsalis pedis and posterior tibial pulses  bilaterally.  The feet are warm and well-perfused.  Neurologic:  Shows  symmetric upper extremity and lower extremity motor strength.  She has a  mild right wrist drop.  Strength in the right hand is 5/5.  She has  some  clumsiness and involuntary actions to the right wrist.   In summary, the patient has asymptomatic bilateral moderate carotid  stenoses with several risk factors for carotid disease.  I believe the  best option currently is continued risk factor modification with  regulation of her diabetes, hypertension and close following of her  cholesterol.  Dr. Nehemiah Settle is currently following these for her.  She will  also continue to take her aspirin daily.  She will return in 6 months'  time for a repeat carotid duplex exam to make sure she has had no  progression of disease, or sooner if she develops symptoms of TIA,  amaurosis or stroke.  Symptoms of these were described to the patient  today.   Janetta Hora. Fields, MD  Electronically Signed   CEF/MEDQ  D:  11/18/2008  T:  11/19/2008  Job:  [id number]   cc:   Deirdre Peer. Polite, M.D.

## 2011-02-21 NOTE — Procedures (Signed)
CAROTID DUPLEX EXAM   INDICATION:  Follow up carotid artery disease.   HISTORY:  Diabetes:  Yes  Cardiac:  No  Hypertension:  Yes  Smoking:  No  Previous Surgery:  No  CV History:  Asymptomatic  Amaurosis Fugax No, Paresthesias No, Hemiparesis No                                       RIGHT             LEFT  Brachial systolic pressure:         132               138  Brachial Doppler waveforms:         WNL               WNL  Vertebral direction of flow:        antegrade         antegrade  DUPLEX VELOCITIES (cm/sec)  CCA peak systolic                   65                68  ECA peak systolic                   96                87  ICA peak systolic                   P=67, D=126       P=52, D=120  ICA end diastolic                   P=19, D=42        P=18, D=41  PLAQUE MORPHOLOGY:                  homogeneous       mixed  PLAQUE AMOUNT:                      mild              mild  PLAQUE LOCATION:                    ICA/bifurcation   ICA/bifurcation   IMPRESSION:  1. Bilateral internal carotid artery velocities are suggestive of 40%      to 59% stenosis (low end of range).  2. Elevated velocities are only noted distally with very minimal      plaque noted in the carotid arteries.  3. No significant changes from previous study.   ___________________________________________  Janetta Hora Fields, MD   AS/MEDQ  D:  11/10/2009  T:  11/11/2009  Job:  [id number]

## 2011-02-21 NOTE — H&P (Signed)
Evelyn Davis, Evelyn Davis               ACCOUNT NO.:  [id number]   MEDICAL RECORD NO.:  [id number]          PATIENT TYPE:  EMS   LOCATION:  MAJO                         FACILITY:  [facility]   PHYSICIAN:  Michiel Cowboy, MDDATE OF BIRTH:  [birth year]   DATE OF ADMISSION:  10/09/2008  DATE OF DISCHARGE:                              HISTORY & PHYSICAL   PRIMARY CARE PHYSICIAN:  Deirdre Peer. Polite, M.D.   CHIEF COMPLAINT:  Fall and loss of consciousness.   HISTORY OF PRESENT ILLNESS:  The patient is a 70 year old female history  of hypertension and diabetes, who for past few days has not been feeling  well.  She was treated by Dr. Nehemiah Settle for bronchitis and urinary tract  infection and cold at the same time with Bactrim and also sinus  infection with Bactrim.  She got up in the morning to use the bathroom  and remembers that when she had to stand up from the commode, she had  the steady herself because she was unstable on her feet which was  somewhat difficult for her.  That is the last thing she remembers and  apparently per family she fell down.  When she woke up, she was somewhat  somnolent but quickly came around but still could not remember what  happened to her.  Memory did not return until some time later once she  only presented to the emergency department.  In the ED, the patient to  stated that she denies actually vertigo right now, but also had vertigo  earlier today, but although denies any symptoms when attempting to walk  the patient, she is still very unsteady on her feet and stumbles over.   Also, the patient endorses having some chest pains which she attributes  to a lot of coughing recently.  No shortness of breath.  No headaches.  Currently no double vision or sensation of spinning.  She has been using  the bathroom more frequently.  Per her, she had been on antibiotics for  now a few days, not sure how many and did not bring her pill bottle with  her.   PAST MEDICAL  HISTORY:  1. Hypertension.  2. Diabetes.   SOCIAL HISTORY:  The patient never smoked.  Drinks alcohol occasionally.  Does not abuse drugs.  Lives at home.  Has a of very supportive and  friendly family.   ALLERGIES:  NO KNOWN DRUG ALLERGIES.   MEDICATIONS:  1. Metformin 1000 mg b.i.d.  2. Ramipril 2.5 mg once a day.  3. Glipizide 10 mg once a day.  4. Hydrochlorothiazide 25 mg once a day.  5. Bactrim recently started for bronchitis and bladder infection and      question fluid in the lungs.   PHYSICAL EXAMINATION:  VITAL SIGNS:  Temperature of 98.2, heart rate 96,  now up to 110 when the patient stands up, respirations 19, blood  pressure 130/68.  Did go down to 97/55.  HEAD:  There is a small bruise over the right frontal area.  Dry mucous  membranes.  Decreased skin turgor.  No JVD noted.  LUNGS:  Distant breath sounds bilaterally, but no crackles.  HEART:  Regular rate and rhythm.  No murmurs appreciated.  Occasionally  somewhat rapid.  ABDOMEN:  Soft, nontender, nondistended.  EXTREMITIES:  Without clubbing, cyanosis or edema.  Strength 5/5.  CRANIAL NERVES:  Intact.  The patient has unsteady gait.   LABORATORY DATA:  Labs white blood cell count 5.6, hemoglobin 12.5,  sodium 122, potassium 3.8, creatinine 0.66, troponin 0.02.  CT scan of  the head showed official hematoma, but otherwise unremarkable.  Chest x-  ray within normal limits.  EKG:  All within normal limits, but does show  some mild T-wave flattening in leads II and III and V4 and V5 as well as  a III and aVF.  No old EKG available.   ASSESSMENT/PLAN:  This is a 70 year old female with history of diabetes  and hypertension who presented after a fall with loss of consciousness  and vertigo, also endorses some chest pain as well.  1. Fall, vertigo, unsteady gait.  Vertigo apparently at this point      resolved, given that the patient is persistent with steady gait and      risk factors, will evaluate for  potential is new CVA.  Get MRI/MRA      of brain.  Hold Septra for right now.  She may also have some      otitis media or an inner ear infection which will probably be able      to be appreciated by head imaging as well.  Will obtain carotid      Dopplers.  Given syncope and the patient being told at some point      that she may have fluid of the lungs, although this could be a      misunderstanding, will obtain 2-D echo.  Admit to tele.  2. Loss of consciousness occurred in association with a fall, but we      will nonetheless cycle cardiac enzymes, admit to tele and watch the      patient to receive EKGs.  She is also having some chest pain.  3. Diabetes.  Will hold metformin and glipizide.  Will do sliding      scale insulin for now.  4. History of hypertension.  Given slight decrease in blood pressure      now, will hold hydrochlorothiazide and Ramipril and follow blood      pressure.  5. Dehydration.  Give IV fluids.  Follow orthostatics.  6. Hyponatremia.  Likely secondary to above, but also will check urine      lytes.  She responds to IV fluids.  Check TSH.  Check cortisol.  7. Prophylaxis Protonix plus SCDs.   Dr. Nehemiah Settle will assume care in the a.m.      Michiel Cowboy, MD  Electronically Signed     AVD/MEDQ  D:  10/09/2008  T:  10/09/2008  Job:  [id number]

## 2011-10-31 ENCOUNTER — Other Ambulatory Visit: Payer: Self-pay | Admitting: Internal Medicine

## 2011-10-31 DIAGNOSIS — Z1231 Encounter for screening mammogram for malignant neoplasm of breast: Secondary | ICD-10-CM

## 2011-11-10 ENCOUNTER — Other Ambulatory Visit: Payer: Medicare Other

## 2011-11-16 ENCOUNTER — Other Ambulatory Visit (INDEPENDENT_AMBULATORY_CARE_PROVIDER_SITE_OTHER): Payer: Medicare Other | Admitting: *Deleted

## 2011-11-16 DIAGNOSIS — I6529 Occlusion and stenosis of unspecified carotid artery: Secondary | ICD-10-CM

## 2011-11-27 ENCOUNTER — Ambulatory Visit
Admission: RE | Admit: 2011-11-27 | Discharge: 2011-11-27 | Disposition: A | Discharge status: Home / Self Care | Payer: Medicare Other | Source: Ambulatory Visit | Attending: Internal Medicine | Admitting: Internal Medicine

## 2011-11-27 DIAGNOSIS — Z1231 Encounter for screening mammogram for malignant neoplasm of breast: Secondary | ICD-10-CM

## 2011-12-01 ENCOUNTER — Encounter: Payer: Self-pay | Admitting: Vascular Surgery

## 2011-12-01 NOTE — Procedures (Unsigned)
CAROTID DUPLEX EXAM  INDICATION:  Follow-up exam.  HISTORY: Diabetes:  Yes. Cardiac:  No. Hypertension:  Yes. Smoking:  No. Previous Surgery:  No. CV History: Amaurosis Fugax No, Paresthesias No, Hemiparesis No.                                      RIGHT             LEFT Brachial systolic pressure:         130               130 Brachial Doppler waveforms:         WNL               WNL Vertebral direction of flow:        Antegrade         Antegrade DUPLEX VELOCITIES (cm/sec) CCA peak systolic                   72                68 ECA peak systolic                   88                78 ICA peak systolic                   58                42 ICA end diastolic                   22                14 PLAQUE MORPHOLOGY:                                    Heterogenous PLAQUE AMOUNT:                      None              Minimal PLAQUE LOCATION:                                      ICA  IMPRESSION: 1. Minimal carotid disease of the bilateral internal carotid arteries.     No plaque could be visualized on the right.  Minimal plaque is     observed on the left. 2. Bilateral external carotid arteries and vertebral arteries are     within normal limits.  ___________________________________________ Janetta Hora Fields, MD  LT/MEDQ  D:  11/16/2011  T:  11/16/2011  Job:  [id number]

## 2012-10-28 ENCOUNTER — Other Ambulatory Visit: Payer: Self-pay | Admitting: Internal Medicine

## 2012-10-28 DIAGNOSIS — Z1231 Encounter for screening mammogram for malignant neoplasm of breast: Secondary | ICD-10-CM

## 2012-11-27 ENCOUNTER — Ambulatory Visit
Admission: RE | Admit: 2012-11-27 | Discharge: 2012-11-27 | Disposition: A | Discharge status: Home / Self Care | Payer: Medicare Other | Source: Ambulatory Visit | Attending: Internal Medicine | Admitting: Internal Medicine

## 2012-11-27 DIAGNOSIS — Z1231 Encounter for screening mammogram for malignant neoplasm of breast: Secondary | ICD-10-CM

## 2013-10-21 ENCOUNTER — Encounter: Payer: Self-pay | Admitting: Cardiology

## 2013-10-21 ENCOUNTER — Ambulatory Visit (INDEPENDENT_AMBULATORY_CARE_PROVIDER_SITE_OTHER): Payer: Medicare Other | Admitting: Cardiology

## 2013-10-21 ENCOUNTER — Encounter (INDEPENDENT_AMBULATORY_CARE_PROVIDER_SITE_OTHER): Payer: Self-pay

## 2013-10-21 VITALS — BP 175/90 | HR 76 | Ht 63.25 in | Wt 205.8 lb

## 2013-10-21 DIAGNOSIS — I10 Essential (primary) hypertension: Secondary | ICD-10-CM

## 2013-10-21 DIAGNOSIS — E669 Obesity, unspecified: Secondary | ICD-10-CM | POA: Insufficient documentation

## 2013-10-21 DIAGNOSIS — I319 Disease of pericardium, unspecified: Secondary | ICD-10-CM

## 2013-10-21 DIAGNOSIS — I313 Pericardial effusion (noninflammatory): Secondary | ICD-10-CM

## 2013-10-21 DIAGNOSIS — I3139 Other pericardial effusion (noninflammatory): Secondary | ICD-10-CM

## 2013-10-21 HISTORY — DX: Obesity, unspecified: E66.9

## 2013-10-21 HISTORY — DX: Pericardial effusion (noninflammatory): I31.3

## 2013-10-21 HISTORY — DX: Essential (primary) hypertension: I10

## 2013-10-21 HISTORY — DX: Other pericardial effusion (noninflammatory): I31.39

## 2013-10-21 NOTE — Progress Notes (Signed)
[place]. [address], [address], [place]  [address] Phone: [phone] Fax:  [phone]  Date:  10/21/2013   ID:  Evelyn Davis, DOB 03/28/1941, MRN [id number]  PCP:  Kandice Hams, MD   History of Present Illness: Evelyn Davis is a 73 y.o. female with diabetes, hypertension with persistent pericardial effusion noted on echocardiogram here for follow up. Echocardiogram on 06/05/13 showed a moderate-sized pericardial effusion 1.7 cm lateral/inferior with no signs of raised or increased intrapericardial pressure. Effusion is essentially unchanged from 6/14 evaluation. At her last evaluation on 8/11 with Dr. Delfina Redwood, she was having no orthopnea, no PND. She had been started on Lasix and was tolerating it well without any new complaints. Her blood pressure was 150/80, pulse was 68. Diastolic dysfunction was noted on echocardiogram and her symptoms were stable.  10/21/13-currently doing very well, no symptoms, no syncope, no chest pain, no shortness of breath and recent urinary tract infection, mild lower extremity edema.   Wt Readings from Last 3 Encounters:  10/21/13 205 lb 12.8 oz (93.35 kg)     No past medical history on file.  No past surgical history on file.  Current Outpatient Prescriptions  Medication Sig Dispense Refill  . ALPHA LIPOIC ACID PO Take by mouth.      Marland Kitchen glipiZIDE (GLUCOTROL) 10 MG tablet Take 10 mg by mouth daily before breakfast.      . hydrochlorothiazide (MICROZIDE) 12.5 MG capsule Take 12.5 mg by mouth daily.      . insulin glargine (LANTUS) 100 UNIT/ML injection Inject 30 Units into the skin at bedtime.      . Magnesium 250 MG TABS Take by mouth.      . metFORMIN (GLUCOPHAGE) 1000 MG tablet Take 1,000 mg by mouth 2 (two) times daily with a meal.      . Multiple Vitamin (MULTIVITAMIN WITH MINERALS) TABS tablet Take 1 tablet by mouth daily.      . niacin 500 MG CR capsule Take 500 mg by mouth at bedtime.      . Omega-3 Fatty Acids (FISH OIL) 1000 MG  CPDR Take by mouth.      . ramipril (ALTACE) 2.5 MG capsule Take 2.5 mg by mouth daily.      . sitaGLIPtin (JANUVIA) 100 MG tablet Take 100 mg by mouth daily.      . vitamin B-12 (CYANOCOBALAMIN) 1000 MCG tablet Take 1,000 mcg by mouth daily.       No current facility-administered medications for this visit.    Allergies:    Allergies  Allergen Reactions  . Penicillins Rash    Social History:  The patient  reports that she has never smoked. She does not have any smokeless tobacco history on file. She reports that she does not drink alcohol or use illicit drugs.   ROS:  Please see the history of present illness.     Denies any fevers, chills, orthopnea, rashes, syncope chest pain  PHYSICAL EXAM: VS:  Ht 5' 3.25" (1.607 m)  Wt 205 lb 12.8 oz (93.35 kg)  BMI 36.15 kg/m2 Well nourished, well developed, in no acute distress HEENT: normal Neck: no JVD Cardiac:  normal S1, S2; RRR; no murmurno rub Lungs:  clear to auscultation bilaterally, no wheezing, rhonchi or rales Abd: soft, nontender, no hepatomegaly Ext: trace BLE edema Skin: warm and dry Neuro: no focal abnormalities noted  EKG:  None today.   ASSESSMENT AND PLAN:  1. Pericardial effusion-chronic, stable, no evidence of  cardiac tamponade. Repeat echocardiogram. Stable. We will see her back on a yearly basis. 2. Hypertension-elevated today. She'll be seeing Dr. Delfina Redwood in February. Continue to work on this. 3. Obesity- encourage weight loss.  Signed, Candee Furbish, MD [facility]  10/21/2013 10:23 AM

## 2013-10-21 NOTE — Patient Instructions (Signed)
Your physician recommends that you continue on your current medications as directed. Please refer to the Current Medication list given to you today.  Your physician wants you to follow-up in: 1 year with Dr. Skains. You will receive a reminder letter in the mail two months in advance. If you don't receive a letter, please call our office to schedule the follow-up appointment.  

## 2013-10-27 ENCOUNTER — Other Ambulatory Visit: Payer: Self-pay

## 2013-10-27 DIAGNOSIS — Z1231 Encounter for screening mammogram for malignant neoplasm of breast: Secondary | ICD-10-CM

## 2013-12-01 ENCOUNTER — Ambulatory Visit
Admission: RE | Admit: 2013-12-01 | Discharge: 2013-12-01 | Disposition: A | Discharge status: Home / Self Care | Payer: Medicare Other | Source: Ambulatory Visit

## 2013-12-01 DIAGNOSIS — Z1231 Encounter for screening mammogram for malignant neoplasm of breast: Secondary | ICD-10-CM

## 2014-01-15 ENCOUNTER — Encounter: Payer: Self-pay | Admitting: *Deleted

## 2014-11-05 ENCOUNTER — Other Ambulatory Visit: Payer: Self-pay

## 2014-11-05 DIAGNOSIS — Z1231 Encounter for screening mammogram for malignant neoplasm of breast: Secondary | ICD-10-CM

## 2014-12-02 ENCOUNTER — Ambulatory Visit (INDEPENDENT_AMBULATORY_CARE_PROVIDER_SITE_OTHER): Payer: Medicare Other | Admitting: Cardiology

## 2014-12-02 ENCOUNTER — Encounter: Payer: Self-pay | Admitting: Cardiology

## 2014-12-02 VITALS — BP 140/66 | HR 78 | Ht 63.0 in | Wt 206.8 lb

## 2014-12-02 DIAGNOSIS — I10 Essential (primary) hypertension: Secondary | ICD-10-CM

## 2014-12-02 DIAGNOSIS — I3139 Other pericardial effusion (noninflammatory): Secondary | ICD-10-CM

## 2014-12-02 DIAGNOSIS — E669 Obesity, unspecified: Secondary | ICD-10-CM

## 2014-12-02 DIAGNOSIS — I319 Disease of pericardium, unspecified: Secondary | ICD-10-CM

## 2014-12-02 DIAGNOSIS — I313 Pericardial effusion (noninflammatory): Secondary | ICD-10-CM

## 2014-12-02 NOTE — Progress Notes (Signed)
[place]. [address] [place], [place]  [address] Phone: [phone] Fax:  [phone]  Date:  12/02/2014   ID:  Tilda Samudio, DOB [birth year], MRN [id number]  PCP:  Kandice Hams, MD   History of Present Illness: Evelyn Davis is a 74 y.o. female with diabetes, hypertension with persistent pericardial effusion noted on echocardiogram here for follow up. Echocardiogram on 06/05/13 showed a moderate-sized pericardial effusion 1.7 cm lateral/inferior with no signs of raised or increased intrapericardial pressure. Effusion is essentially unchanged from 6/14 evaluation. At her last evaluation on 8/11 with Dr. Delfina Redwood, she was having no orthopnea, no PND. She had been started on Lasix and was tolerating it well without any new complaints. Her blood pressure was 150/80, pulse was 68. Diastolic dysfunction was noted on echocardiogram and her symptoms were stable.  10/21/13-currently doing very well, no symptoms, no syncope, no chest pain, no shortness of breath and recent urinary tract infection, mild lower extremity edema.  12/02/14 - once again she is doing very well, no symptoms, no syncope, no orthopnea. She does have some shortness of breath with heavy exertional activity. She states that this may be because her husband recently had hip surgery and she had to perform more strenuous tasks than usual. They have been married for 53 years.   Wt Readings from Last 3 Encounters:  12/02/14 206 lb 12.8 oz (93.804 kg)  10/21/13 205 lb 12.8 oz (93.35 kg)     Past Medical History  Diagnosis Date  . Pericardial effusion 10/21/2013    Chronic. No changes on ECHO.    . Obesity, unspecified 10/21/2013  . HTN (hypertension) 10/21/2013  . Diabetes   . Other and unspecified hyperlipidemia   . Depressive disorder, not elsewhere classified     History reviewed. No pertinent past surgical history.  Current Outpatient Prescriptions  Medication Sig Dispense Refill  . aspirin 81 MG tablet Take  81 mg by mouth daily.    . furosemide (LASIX) 40 MG tablet Take 40 mg by mouth daily.    Marland Kitchen glipiZIDE (GLUCOTROL) 10 MG tablet Take 10 mg by mouth daily before breakfast.    . insulin glargine (LANTUS) 100 UNIT/ML injection Inject 40 Units into the skin at bedtime.     Marland Kitchen losartan (COZAAR) 50 MG tablet Take 50 mg by mouth daily.    . metFORMIN (GLUCOPHAGE) 1000 MG tablet Take 1,000 mg by mouth 2 (two) times daily with a meal.    . potassium chloride (MICRO-K) 10 MEQ CR capsule Take 10 mEq by mouth daily.    . simvastatin (ZOCOR) 20 MG tablet Take 20 mg by mouth daily.    . sitaGLIPtin (JANUVIA) 100 MG tablet Take 100 mg by mouth daily.     No current facility-administered medications for this visit.    Allergies:    Allergies  Allergen Reactions  . Penicillins Rash    Social History:  The patient  reports that she has never smoked. She does not have any smokeless tobacco history on file. She reports that she does not drink alcohol or use illicit drugs.   ROS:  Please see the history of present illness.     Denies any fevers, chills, orthopnea, rashes, syncope chest pain  PHYSICAL EXAM: VS:  BP 140/66 mmHg  Pulse 78  Ht 5\' 3"  (1.6 m)  Wt 206 lb 12.8 oz (93.804 kg)  BMI 36.64 kg/m2 Well nourished, well developed, in no acute distress HEENT: normal Neck: no JVD  Cardiac:  normal S1, S2; RRR; no murmurno rub Lungs:  clear to auscultation bilaterally, no wheezing, rhonchi or rales Abd: soft, nontender, no hepatomegaly Ext: 1+ BLE edema Skin: warm and dry Neuro: no focal abnormalities noted  EKG: 12/02/14-sinus rhythm, 78, vertical axis. None today.   ASSESSMENT AND PLAN:  1. Pericardial effusion-chronic, stable, no evidence of cardiac tamponade. She does have some shortness of breath with activity which could be conditioning as well as weight. Continue to monitor. Repeat echocardiogram likely in one year. Stable. We will see her back on a yearly basis. 2. Upper normal today  essential hypertension.  Dr. Delfina Redwood added furosemide in February. Continue to work on this. 3. Obesity- encourage weight loss.  Signed, Candee Furbish, MD [facility]  12/02/2014 11:56 AM

## 2014-12-02 NOTE — Patient Instructions (Signed)
Your physician recommends that you continue on your current medications as directed. Please refer to the Current Medication list given to you today.  Your physician wants you to follow-up in: 1 year. You will receive a reminder letter in the mail two months in advance. If you don't receive a letter, please call our office to schedule the follow-up appointment.  

## 2014-12-07 ENCOUNTER — Ambulatory Visit
Admission: RE | Admit: 2014-12-07 | Discharge: 2014-12-07 | Disposition: A | Discharge status: Home / Self Care | Payer: Medicare Other | Source: Ambulatory Visit

## 2014-12-07 DIAGNOSIS — Z1231 Encounter for screening mammogram for malignant neoplasm of breast: Secondary | ICD-10-CM

## 2015-10-28 ENCOUNTER — Other Ambulatory Visit: Payer: Self-pay

## 2015-10-28 DIAGNOSIS — Z1231 Encounter for screening mammogram for malignant neoplasm of breast: Secondary | ICD-10-CM

## 2015-12-08 ENCOUNTER — Ambulatory Visit
Admission: RE | Admit: 2015-12-08 | Discharge: 2015-12-08 | Disposition: A | Discharge status: Home / Self Care | Payer: Medicare Other | Source: Ambulatory Visit

## 2015-12-08 DIAGNOSIS — Z1231 Encounter for screening mammogram for malignant neoplasm of breast: Secondary | ICD-10-CM

## 2016-05-17 ENCOUNTER — Other Ambulatory Visit: Payer: Self-pay | Admitting: Gastroenterology

## 2016-06-26 ENCOUNTER — Encounter (HOSPITAL_COMMUNITY): Payer: Self-pay | Admitting: *Deleted

## 2016-06-27 ENCOUNTER — Encounter (HOSPITAL_COMMUNITY): Payer: Self-pay | Admitting: *Deleted

## 2016-07-03 ENCOUNTER — Encounter (HOSPITAL_COMMUNITY): Payer: Self-pay

## 2016-07-03 ENCOUNTER — Ambulatory Visit (HOSPITAL_COMMUNITY): Payer: Medicare Other | Admitting: Certified Registered Nurse Anesthetist

## 2016-07-03 ENCOUNTER — Encounter (HOSPITAL_COMMUNITY)
Admission: RE | Disposition: A | Discharge status: Home / Self Care | Payer: Self-pay | Source: Ambulatory Visit | Attending: Gastroenterology

## 2016-07-03 ENCOUNTER — Ambulatory Visit (HOSPITAL_COMMUNITY)
Admission: RE | Admit: 2016-07-03 | Discharge: 2016-07-03 | Disposition: A | Discharge status: Home / Self Care | Payer: Medicare Other | Source: Ambulatory Visit | Attending: Gastroenterology | Admitting: Gastroenterology

## 2016-07-03 DIAGNOSIS — Z794 Long term (current) use of insulin: Secondary | ICD-10-CM | POA: Diagnosis not present

## 2016-07-03 DIAGNOSIS — E7800 Pure hypercholesterolemia, unspecified: Secondary | ICD-10-CM | POA: Diagnosis not present

## 2016-07-03 DIAGNOSIS — E119 Type 2 diabetes mellitus without complications: Secondary | ICD-10-CM | POA: Insufficient documentation

## 2016-07-03 DIAGNOSIS — Z6836 Body mass index (BMI) 36.0-36.9, adult: Secondary | ICD-10-CM | POA: Diagnosis not present

## 2016-07-03 DIAGNOSIS — Z79899 Other long term (current) drug therapy: Secondary | ICD-10-CM | POA: Insufficient documentation

## 2016-07-03 DIAGNOSIS — I10 Essential (primary) hypertension: Secondary | ICD-10-CM | POA: Diagnosis not present

## 2016-07-03 DIAGNOSIS — G4733 Obstructive sleep apnea (adult) (pediatric): Secondary | ICD-10-CM | POA: Insufficient documentation

## 2016-07-03 DIAGNOSIS — Z7982 Long term (current) use of aspirin: Secondary | ICD-10-CM | POA: Diagnosis not present

## 2016-07-03 DIAGNOSIS — Z1211 Encounter for screening for malignant neoplasm of colon: Secondary | ICD-10-CM | POA: Insufficient documentation

## 2016-07-03 HISTORY — PX: COLONOSCOPY WITH PROPOFOL: SHX5780

## 2016-07-03 LAB — GLUCOSE, CAPILLARY: GLUCOSE-CAPILLARY: 163 mg/dL — AB (ref 65–99)

## 2016-07-03 SURGERY — COLONOSCOPY WITH PROPOFOL
Anesthesia: Monitor Anesthesia Care

## 2016-07-03 MED ORDER — PROPOFOL 500 MG/50ML IV EMUL
INTRAVENOUS | Status: DC | PRN
  Administered 2016-07-03: 75 ug/kg/min via INTRAVENOUS

## 2016-07-03 MED ORDER — SODIUM CHLORIDE 0.9 % IV SOLN: INTRAVENOUS | Status: DC

## 2016-07-03 MED ORDER — ONDANSETRON HCL 4 MG/2ML IJ SOLN
INTRAMUSCULAR | Status: DC | PRN
  Administered 2016-07-03: 4 mg via INTRAVENOUS

## 2016-07-03 MED ORDER — LIDOCAINE 2% (20 MG/ML) 5 ML SYRINGE
INTRAMUSCULAR | Status: AC
  Filled 2016-07-03: qty 5

## 2016-07-03 MED ORDER — LIDOCAINE 2% (20 MG/ML) 5 ML SYRINGE
INTRAMUSCULAR | Status: DC | PRN
  Administered 2016-07-03: 100 mg via INTRAVENOUS

## 2016-07-03 MED ORDER — LACTATED RINGERS IV SOLN
INTRAVENOUS | Status: DC
  Administered 2016-07-03: 1000 mL via INTRAVENOUS

## 2016-07-03 MED ORDER — PROPOFOL 10 MG/ML IV BOLUS
INTRAVENOUS | Status: AC
  Filled 2016-07-03: qty 60

## 2016-07-03 MED ORDER — PROPOFOL 10 MG/ML IV BOLUS
INTRAVENOUS | Status: DC | PRN
  Administered 2016-07-03: 30 mg via INTRAVENOUS
  Administered 2016-07-03: 10 mg via INTRAVENOUS
  Administered 2016-07-03 (×3): 20 mg via INTRAVENOUS

## 2016-07-03 MED ORDER — ONDANSETRON HCL 4 MG/2ML IJ SOLN
INTRAMUSCULAR | Status: AC
  Filled 2016-07-03: qty 2

## 2016-07-03 SURGICAL SUPPLY — 21 items

## 2016-07-03 NOTE — Anesthesia Postprocedure Evaluation (Signed)
Anesthesia Post Note  Patient: Evelyn Davis  Procedure(s) Performed: Procedure(s) (LRB): COLONOSCOPY WITH PROPOFOL (N/A)  Patient location during evaluation: Endoscopy Anesthesia Type: MAC Level of consciousness: awake Pain management: pain level controlled Vital Signs Assessment: post-procedure vital signs reviewed and stable Respiratory status: spontaneous breathing Cardiovascular status: stable Postop Assessment: no signs of nausea or vomiting Anesthetic complications: no    Last Vitals:  Vitals:   07/03/16 0905 07/03/16 0930  BP: (!) 114/46 (!) 146/70  Pulse: 65   Resp: 19   Temp: 36.5 C     Last Pain:  Vitals:   07/03/16 0905  TempSrc: Oral                 Keaira Whitehurst

## 2016-07-03 NOTE — Op Note (Signed)
[facility] Patient Name: Evelyn Davis Procedure Date: 07/03/2016 MRN: [id number] Attending MD: Garlan Fair , MD Date of Birth: 07-27-1941 CSN: [id number] Age: 75 Admit Type: Outpatient Procedure:                Colonoscopy Indications:              Screening for colorectal malignant neoplasm. Normal                            screening colonoscopy was performed on 01/02/2006. Providers:                Garlan Fair, MD, Cleda Daub, RN, Ralene Bathe, Technician, Adair Laundry, CRNA Referring MD:              Medicines:                Propofol per Anesthesia Complications:            No immediate complications. Estimated Blood Loss:     Estimated blood loss: none. Procedure:                Pre-Anesthesia Assessment:                           - Prior to the procedure, a History and Physical                            was performed, and patient medications and                            allergies were reviewed. The patient's tolerance of                            previous anesthesia was also reviewed. The risks                            and benefits of the procedure and the sedation                            options and risks were discussed with the patient.                            All questions were answered, and informed consent                            was obtained. Prior Anticoagulants: The patient has                            taken aspirin, last dose was 1 day prior to                            procedure. ASA Grade Assessment: III - A patient  with severe systemic disease. After reviewing the                            risks and benefits, the patient was deemed in                            satisfactory condition to undergo the procedure.                           After obtaining informed consent, the colonoscope                            was passed under direct vision. Throughout the                       procedure, the patient's blood pressure, pulse, and                            oxygen saturations were monitored continuously. The                            [id number] scope was introduced through                            the anus and advanced to the the cecum, identified                            by appendiceal orifice and ileocecal valve. The                            colonoscopy was performed without difficulty. The                            patient tolerated the procedure well. The quality                            of the bowel preparation was good. The ileocecal                            valve, the appendiceal orifice and the rectum were                            photographed. Scope In: 8:47:11 AM Scope Out: 9:03:10 AM Scope Withdrawal Time: 0 hours 9 minutes 32 seconds  Total Procedure Duration: 0 hours 15 minutes 59 seconds  Findings:      The perianal and digital rectal examinations were normal.      The entire examined colon appeared normal. Impression:               - The entire examined colon is normal.                           - No specimens collected. Moderate Sedation:      N/A- Per Anesthesia Care Recommendation:           - Patient  has a contact number available for                            emergencies. The signs and symptoms of potential                            delayed complications were discussed with the                            patient. Return to normal activities tomorrow.                            Written discharge instructions were provided to the                            patient.                           - Repeat colonoscopy is not recommended for                            screening purposes.                           - Resume previous diet.                           - Continue present medications. Procedure Code(s):        --- Professional ---                           [id number], Colorectal cancer screening;  colonoscopy on                            individual not meeting criteria for high risk Diagnosis Code(s):        --- Professional ---                           Z12.11, Encounter for screening for malignant                            neoplasm of colon CPT copyright 2016 [employer] Medical Association. All rights reserved. The codes documented in this report are preliminary and upon coder review may  be revised to meet current compliance requirements. Earle Gell, MD Garlan Fair, MD 07/03/2016 9:10:16 AM This report has been signed electronically. Number of Addenda: 0

## 2016-07-03 NOTE — Discharge Instructions (Signed)

## 2016-07-03 NOTE — H&P (Signed)
Procedure: Screening colonoscopy. Normal screening colonoscopy was performed on 01/02/2006  History: The patient is a 75 year old female born July 27, 1941. She is scheduled to undergo a repeat screening colonoscopy today  Past medical history: Hypertension. Gastroesophageal reflux. Hypercholesterolemia. Carotid artery disease. Obstructive sleep apnea syndrome. Type 2 diabetes mellitus. Cataracts. Hysterectomy to treat uterine fibroids.  Medication allergies: None  Family history: Negative for colon cancer  Exam: The patient is alert and lying comfortably on the endoscopy stretcher. Abdomen is soft and nontender to palpation. Lungs are clear to auscultation. Cardiac exam reveals a regular rhythm.  Plan: Proceed with screening colonoscopy

## 2016-07-03 NOTE — Transfer of Care (Signed)
Immediate Anesthesia Transfer of Care Note  Patient: Evelyn Davis  Procedure(s) Performed: Procedure(s): COLONOSCOPY WITH PROPOFOL (N/A)  Patient Location: ENDO  Anesthesia Type:MAC  Level of Consciousness:  sedated, patient cooperative and responds to stimulation  Airway & Oxygen Therapy:Patient Spontanous Breathing and Patient connected to face mask oxgen  Post-op Assessment:  Report given to ENDO RN and Post -op Vital signs reviewed and stable  Post vital signs:  Reviewed and stable  Last Vitals:  Vitals:   07/03/16 0826  BP: (!) 171/60  Pulse: 68  Resp: 16  Temp: [id number] C    Complications: No apparent anesthesia complications

## 2016-07-03 NOTE — Anesthesia Preprocedure Evaluation (Signed)
Anesthesia Evaluation  Patient identified by MRN, date of birth, ID band Patient awake    Reviewed: Allergy & Precautions, NPO status , Patient's Chart, lab work & pertinent test results  History of Anesthesia Complications Negative for: history of anesthetic complications  Airway Mallampati: II  TM Distance: >3 FB Neck ROM: Full    Dental  (+) Teeth Intact   Pulmonary neg pulmonary ROS,    breath sounds clear to auscultation       Cardiovascular hypertension, Pt. on medications (-) angina(-) Past MI and (-) CHF  Rhythm:Regular     Neuro/Psych PSYCHIATRIC DISORDERS Depression negative neurological ROS     GI/Hepatic negative GI ROS, Neg liver ROS,   Endo/Other  diabetes, Type 2, Insulin DependentMorbid obesity  Renal/GU negative Renal ROS     Musculoskeletal   Abdominal   Peds  Hematology   Anesthesia Other Findings   Reproductive/Obstetrics                             Anesthesia Physical Anesthesia Plan  ASA: II  Anesthesia Plan: MAC   Post-op Pain Management:    Induction: Intravenous  Airway Management Planned: Natural Airway, Nasal Cannula and Simple Face Mask  Additional Equipment: None  Intra-op Plan:   Post-operative Plan:   Informed Consent: I have reviewed the patients History and Physical, chart, labs and discussed the procedure including the risks, benefits and alternatives for the proposed anesthesia with the patient or authorized representative who has indicated his/her understanding and acceptance.   Dental advisory given  Plan Discussed with: CRNA and Surgeon  Anesthesia Plan Comments:         Anesthesia Quick Evaluation

## 2016-07-03 NOTE — OR Nursing (Signed)
Pt. Educated on the importance of taking her Blood Pressure medications when she returned home this morning.    Laverta Baltimore, RN

## 2016-07-05 ENCOUNTER — Encounter (HOSPITAL_COMMUNITY): Payer: Self-pay | Admitting: Gastroenterology

## 2016-09-07 ENCOUNTER — Other Ambulatory Visit: Payer: Self-pay | Admitting: Internal Medicine

## 2016-09-07 DIAGNOSIS — I6523 Occlusion and stenosis of bilateral carotid arteries: Secondary | ICD-10-CM

## 2016-09-13 ENCOUNTER — Ambulatory Visit
Admission: RE | Admit: 2016-09-13 | Discharge: 2016-09-13 | Disposition: A | Discharge status: Home / Self Care | Payer: Medicare Other | Source: Ambulatory Visit | Attending: Internal Medicine | Admitting: Internal Medicine

## 2016-09-13 DIAGNOSIS — I6523 Occlusion and stenosis of bilateral carotid arteries: Secondary | ICD-10-CM

## 2016-09-28 ENCOUNTER — Telehealth: Payer: Self-pay | Admitting: Vascular Surgery

## 2016-09-28 NOTE — Telephone Encounter (Signed)
As per the Recall Report, the patient is due for a 5 year follow up and carotid US with Dr. Oneida Alar. I called the patient to see if she is still interested to come to our office. The patient states that Dr. Delfina Redwood had order a carotid US on 09/13/16 and that she will discuss with him if she needs to come back when she follows up in March.

## 2016-11-03 ENCOUNTER — Other Ambulatory Visit: Payer: Self-pay | Admitting: Internal Medicine

## 2016-11-03 DIAGNOSIS — Z1231 Encounter for screening mammogram for malignant neoplasm of breast: Secondary | ICD-10-CM

## 2016-12-11 ENCOUNTER — Ambulatory Visit: Payer: Medicare Other

## 2016-12-11 ENCOUNTER — Ambulatory Visit
Admission: RE | Admit: 2016-12-11 | Discharge: 2016-12-11 | Disposition: A | Discharge status: Home / Self Care | Payer: Medicare Other | Source: Ambulatory Visit | Attending: Internal Medicine | Admitting: Internal Medicine

## 2016-12-11 DIAGNOSIS — Z1231 Encounter for screening mammogram for malignant neoplasm of breast: Secondary | ICD-10-CM

## 2017-11-01 ENCOUNTER — Other Ambulatory Visit: Payer: Self-pay | Admitting: Internal Medicine

## 2017-11-01 DIAGNOSIS — Z1231 Encounter for screening mammogram for malignant neoplasm of breast: Secondary | ICD-10-CM

## 2017-12-12 ENCOUNTER — Ambulatory Visit: Payer: Medicare Other

## 2017-12-12 ENCOUNTER — Ambulatory Visit
Admission: RE | Admit: 2017-12-12 | Discharge: 2017-12-12 | Disposition: A | Discharge status: Home / Self Care | Payer: Medicare Other | Source: Ambulatory Visit | Attending: Internal Medicine | Admitting: Internal Medicine

## 2017-12-12 ENCOUNTER — Other Ambulatory Visit: Payer: Self-pay | Admitting: Internal Medicine

## 2017-12-12 DIAGNOSIS — Z1231 Encounter for screening mammogram for malignant neoplasm of breast: Secondary | ICD-10-CM

## 2017-12-19 ENCOUNTER — Other Ambulatory Visit: Payer: Self-pay

## 2017-12-19 ENCOUNTER — Other Ambulatory Visit: Payer: Self-pay | Admitting: Internal Medicine

## 2017-12-19 DIAGNOSIS — N63 Unspecified lump in unspecified breast: Secondary | ICD-10-CM

## 2017-12-20 ENCOUNTER — Ambulatory Visit
Admission: RE | Admit: 2017-12-20 | Discharge: 2017-12-20 | Disposition: A | Discharge status: Home / Self Care | Payer: Medicare Other | Source: Ambulatory Visit | Attending: Internal Medicine | Admitting: Internal Medicine

## 2017-12-20 DIAGNOSIS — N63 Unspecified lump in unspecified breast: Secondary | ICD-10-CM

## 2018-11-12 ENCOUNTER — Other Ambulatory Visit: Payer: Self-pay | Admitting: Internal Medicine

## 2018-11-12 DIAGNOSIS — Z1231 Encounter for screening mammogram for malignant neoplasm of breast: Secondary | ICD-10-CM

## 2018-12-23 ENCOUNTER — Ambulatory Visit
Admission: RE | Admit: 2018-12-23 | Discharge: 2018-12-23 | Disposition: A | Discharge status: Home / Self Care | Payer: Medicare Other | Source: Ambulatory Visit | Attending: Internal Medicine | Admitting: Internal Medicine

## 2018-12-23 ENCOUNTER — Other Ambulatory Visit: Payer: Self-pay

## 2018-12-23 DIAGNOSIS — Z1231 Encounter for screening mammogram for malignant neoplasm of breast: Secondary | ICD-10-CM

## 2019-11-13 ENCOUNTER — Other Ambulatory Visit: Payer: Self-pay | Admitting: Internal Medicine

## 2019-11-13 DIAGNOSIS — Z1231 Encounter for screening mammogram for malignant neoplasm of breast: Secondary | ICD-10-CM

## 2019-12-24 ENCOUNTER — Other Ambulatory Visit: Payer: Self-pay

## 2019-12-24 ENCOUNTER — Ambulatory Visit
Admission: RE | Admit: 2019-12-24 | Discharge: 2019-12-24 | Disposition: A | Discharge status: Home / Self Care | Payer: Medicare Other | Source: Ambulatory Visit | Attending: Internal Medicine | Admitting: Internal Medicine

## 2019-12-24 DIAGNOSIS — Z1231 Encounter for screening mammogram for malignant neoplasm of breast: Secondary | ICD-10-CM

## 2020-11-03 DIAGNOSIS — E782 Mixed hyperlipidemia: Secondary | ICD-10-CM | POA: Diagnosis not present

## 2020-11-03 DIAGNOSIS — E7800 Pure hypercholesterolemia, unspecified: Secondary | ICD-10-CM | POA: Diagnosis not present

## 2020-11-03 DIAGNOSIS — E1165 Type 2 diabetes mellitus with hyperglycemia: Secondary | ICD-10-CM | POA: Diagnosis not present

## 2020-11-03 DIAGNOSIS — I10 Essential (primary) hypertension: Secondary | ICD-10-CM | POA: Diagnosis not present

## 2020-11-04 DIAGNOSIS — E113293 Type 2 diabetes mellitus with mild nonproliferative diabetic retinopathy without macular edema, bilateral: Secondary | ICD-10-CM | POA: Diagnosis not present

## 2020-12-02 ENCOUNTER — Other Ambulatory Visit: Payer: Self-pay | Admitting: Internal Medicine

## 2020-12-02 DIAGNOSIS — Z1231 Encounter for screening mammogram for malignant neoplasm of breast: Secondary | ICD-10-CM

## 2020-12-03 DIAGNOSIS — E7800 Pure hypercholesterolemia, unspecified: Secondary | ICD-10-CM | POA: Diagnosis not present

## 2020-12-03 DIAGNOSIS — E1165 Type 2 diabetes mellitus with hyperglycemia: Secondary | ICD-10-CM | POA: Diagnosis not present

## 2020-12-03 DIAGNOSIS — E782 Mixed hyperlipidemia: Secondary | ICD-10-CM | POA: Diagnosis not present

## 2020-12-03 DIAGNOSIS — I10 Essential (primary) hypertension: Secondary | ICD-10-CM | POA: Diagnosis not present

## 2020-12-09 ENCOUNTER — Other Ambulatory Visit: Payer: Self-pay

## 2020-12-09 ENCOUNTER — Encounter: Payer: Self-pay | Admitting: Podiatry

## 2020-12-09 ENCOUNTER — Ambulatory Visit: Payer: Medicare Other | Admitting: Podiatry

## 2020-12-09 DIAGNOSIS — M2042 Other hammer toe(s) (acquired), left foot: Secondary | ICD-10-CM | POA: Diagnosis not present

## 2020-12-09 DIAGNOSIS — M2041 Other hammer toe(s) (acquired), right foot: Secondary | ICD-10-CM

## 2020-12-09 DIAGNOSIS — E782 Mixed hyperlipidemia: Secondary | ICD-10-CM | POA: Insufficient documentation

## 2020-12-09 DIAGNOSIS — K219 Gastro-esophageal reflux disease without esophagitis: Secondary | ICD-10-CM | POA: Insufficient documentation

## 2020-12-09 DIAGNOSIS — R600 Localized edema: Secondary | ICD-10-CM | POA: Diagnosis not present

## 2020-12-09 DIAGNOSIS — E1165 Type 2 diabetes mellitus with hyperglycemia: Secondary | ICD-10-CM | POA: Insufficient documentation

## 2020-12-09 DIAGNOSIS — M79674 Pain in right toe(s): Secondary | ICD-10-CM

## 2020-12-09 DIAGNOSIS — E119 Type 2 diabetes mellitus without complications: Secondary | ICD-10-CM

## 2020-12-09 DIAGNOSIS — Z794 Long term (current) use of insulin: Secondary | ICD-10-CM | POA: Insufficient documentation

## 2020-12-09 DIAGNOSIS — M79675 Pain in left toe(s): Secondary | ICD-10-CM | POA: Diagnosis not present

## 2020-12-09 DIAGNOSIS — G4733 Obstructive sleep apnea (adult) (pediatric): Secondary | ICD-10-CM | POA: Insufficient documentation

## 2020-12-09 DIAGNOSIS — I872 Venous insufficiency (chronic) (peripheral): Secondary | ICD-10-CM | POA: Insufficient documentation

## 2020-12-09 DIAGNOSIS — B351 Tinea unguium: Secondary | ICD-10-CM

## 2020-12-09 DIAGNOSIS — E1142 Type 2 diabetes mellitus with diabetic polyneuropathy: Secondary | ICD-10-CM | POA: Diagnosis not present

## 2020-12-09 DIAGNOSIS — M25529 Pain in unspecified elbow: Secondary | ICD-10-CM | POA: Insufficient documentation

## 2020-12-09 DIAGNOSIS — E7800 Pure hypercholesterolemia, unspecified: Secondary | ICD-10-CM | POA: Insufficient documentation

## 2020-12-09 DIAGNOSIS — E113293 Type 2 diabetes mellitus with mild nonproliferative diabetic retinopathy without macular edema, bilateral: Secondary | ICD-10-CM | POA: Insufficient documentation

## 2020-12-09 DIAGNOSIS — M25569 Pain in unspecified knee: Secondary | ICD-10-CM | POA: Insufficient documentation

## 2020-12-09 DIAGNOSIS — I6523 Occlusion and stenosis of bilateral carotid arteries: Secondary | ICD-10-CM | POA: Insufficient documentation

## 2020-12-09 NOTE — Progress Notes (Signed)
Subjective: Evelyn Davis presents today referred by Evelyn Carol, MD for diabetic foot evaluation. Her daughter, Evelyn Davis, is present during today's visit.  PCP is Dr. Seward Davis and last visit was 11/10/2020.  She states blood glucose was 127 mg/dl this morning.  Patient relates 40+ year history of diabetes.  Patient denies any history of foot wounds.  Patient does have occasional symptoms of numbness in feet.  Patient doe have occasional symptoms of tingling in feet.  Patient denies symptoms of burning in feet.  Patient does have occasional symptoms of pins/needles sensations in feet.  Today, patient c/o of painful, discolored, thick toenails which interfere with daily activities.  Pain is aggravated when wearing enclosed shoe gear.   Past Medical History:  Diagnosis Date  . Depressive disorder, not elsewhere classified 10 yrs ago  . Diabetes ([place])   . HTN (hypertension) 10/21/2013  . Obesity, unspecified 10/21/2013  . Other and unspecified hyperlipidemia   . Pericardial effusion 10/21/2013   Chronic. No changes on ECHO.      Patient Active Problem List   Diagnosis Date Noted  . Esophageal reflux 12/09/2020  . Pain in joint, upper arm 12/09/2020  . Hyperglycemia due to type 2 diabetes mellitus ([place]) 12/09/2020  . Knee pain 12/09/2020  . Long term (current) use of insulin ([place]) 12/09/2020  . Mixed hyperlipidemia 12/09/2020  . Obstructive sleep apnea syndrome 12/09/2020  . Occlusion and stenosis of bilateral carotid arteries 12/09/2020  . Pure hypercholesterolemia 12/09/2020  . Stasis dermatitis 12/09/2020  . Type 2 diabetes mellitus with mild nonproliferative diabetic retinopathy without macular edema, bilateral ([place]) 12/09/2020  . Pericardial effusion 10/21/2013  . HTN (hypertension) 10/21/2013  . Obesity 10/21/2013    Past Surgical History:  Procedure Laterality Date  . ABDOMINAL HYSTERECTOMY     complete  . BREAST BIOPSY Left   . COLONOSCOPY WITH  PROPOFOL N/A 07/03/2016   Procedure: COLONOSCOPY WITH PROPOFOL;  Surgeon: Garlan Fair, MD;  Location: [facility];  Service: Endoscopy;  Laterality: N/A;  . KNEE ARTHROSCOPY Left yrs ago    Current Outpatient Medications on File Prior to Visit  Medication Sig Dispense Refill  . aspirin 81 MG tablet Take 81 mg by mouth daily.    . Cyanocobalamin (VITAMIN B 12 PO) Take by mouth.    . furosemide (LASIX) 40 MG tablet Take 40 mg by mouth daily.    Marland Kitchen glipiZIDE (GLUCOTROL) 10 MG tablet Take 10 mg by mouth daily before breakfast.    . insulin glargine (LANTUS) 100 UNIT/ML injection Inject 40 Units into the skin at bedtime.     Marland Kitchen losartan (COZAAR) 50 MG tablet Take 50 mg by mouth daily.    . Magnesium 250 MG TABS Take by mouth daily.    . metFORMIN (GLUCOPHAGE) 1000 MG tablet Take 1,000 mg by mouth 2 (two) times daily with a meal.    . Multiple Vitamins-Minerals (MULTIVITAMIN WITH MINERALS) tablet Take 1 tablet by mouth daily.    . niacin 250 MG tablet Take 250 mg by mouth at bedtime.    . Omega-3 Fatty Acids (FISH OIL) 1000 MG CAPS Take by mouth 2 (two) times daily.    . potassium chloride (MICRO-K) 10 MEQ CR capsule Take 10 mEq by mouth daily.    . simvastatin (ZOCOR) 20 MG tablet Take 20 mg by mouth every evening.     . sitaGLIPtin (JANUVIA) 100 MG tablet Take 100 mg by mouth daily.     No current facility-administered medications on file  prior to visit.     Allergies  Allergen Reactions  . Penicillins Rash    Social History   Occupational History  . Not on file  Tobacco Use  . Smoking status: Never Smoker  . Smokeless tobacco: Never Used  Substance and Sexual Activity  . Alcohol use: No  . Drug use: No  . Sexual activity: Not on file    Family History  Problem Relation Age of Onset  . Hypertension Mother   . Diabetes Mother   . Heart Problems Mother   . Hypertension Father   . Heart Problems Father      There is no immunization history on file for this  patient.  Objective: There were no vitals filed for this visit.  Evelyn Davis is a pleasant 80 y.o. female WD, WN in NAD. AAO X 3.  Vascular Examination: Capillary refill time to digits immediate b/l. Palpable pedal pulses b/l LE. Pedal hair sparse. Lower extremity skin temperature gradient within normal limits. No pain with calf compression b/l. +2 pitting edema b/l lower extremities.  Dermatological Examination: Pedal skin with normal turgor, texture and tone bilaterally. No open wounds bilaterally. No interdigital macerations bilaterally. Toenails 1-5 b/l elongated, discolored, dystrophic, thickened, crumbly with subungual debris and tenderness to dorsal palpation. Pedal skin noted to be dry and flaky b/l lower extremities.  Musculoskeletal Examination: Normal muscle strength 5/5 to all lower extremity muscle groups bilaterally. No pain crepitus or joint limitation noted with ROM b/l. Hammertoes noted to the L 5th toe and R 5th toe.  Footwear Assessment: Does the patient wear appropriate shoes? Yes. Does the patient need inserts/orthotics? She may benefit from diabetic inserts.  Neurological Examination: Protective sensation intact 5/5 intact bilaterally with 10g monofilament b/l. Vibratory sensation decreased b/l.  Assessment: 1. Pain due to onychomycosis of toenails of both feet   2. Acquired hammertoes of both feet   3. Lower extremity edema   4. Diabetic peripheral neuropathy associated with type 2 diabetes mellitus ([place])   5. Encounter for diabetic foot exam ([place])      ADA Risk Categorization: Low Risk:  Patient has all of the following: Intact protective sensation No prior foot ulcer  No severe deformity Pedal pulses present  Plan: -Examined patient. -Diabetic foot examination performed on today's visit. -Discussed diabetic foot care principles. Literature dispensed on today. -Patient to continue soft, supportive shoe gear daily. -Toenails 1-5 b/l were  debrided in length and girth with sterile nail nippers and dremel without iatrogenic bleeding.  -Patient to report any pedal injuries to medical professional immediately. -Patient instructed to apply tea tree oil to toenails once daily. -Patient/POA to call should there be question/concern in the interim.  Return in about 3 months (around 03/11/2021).  Marzetta Board, DPM

## 2020-12-09 NOTE — Patient Instructions (Signed)
Apply tea tree oil to toenails once daily.  Do not cut toenails. You may file with emery board if they become long.  Onychomycosis/Fungal Toenails  WHAT IS IT? An infection that lies within the keratin of your nail plate that is caused by a fungus.  WHY ME? Fungal infections affect all ages, sexes, races, and creeds.  There may be many factors that predispose you to a fungal infection such as age, coexisting medical conditions such as diabetes, or an autoimmune disease; stress, medications, fatigue, genetics, etc.  Bottom line: fungus thrives in a warm, moist environment and your shoes offer such a location.  IS IT CONTAGIOUS? Theoretically, yes.  You do not want to share shoes, nail clippers or files with someone who has fungal toenails.  Walking around barefoot in the same room or sleeping in the same bed is unlikely to transfer the organism.  It is important to realize, however, that fungus can spread easily from one nail to the next on the same foot.  HOW DO WE TREAT THIS?  There are several ways to treat this condition.  Treatment may depend on many factors such as age, medications, pregnancy, liver and kidney conditions, etc.  It is best to ask your doctor which options are available to you.  1. No treatment.   Unlike many other medical concerns, you can live with this condition.  However for many people this can be a painful condition and may lead to ingrown toenails or a bacterial infection.  It is recommended that you keep the nails cut short to help reduce the amount of fungal nail. 2. Topical treatment.  These range from herbal remedies to prescription strength nail lacquers.  About 40-50% effective, topicals require twice daily application for approximately 9 to 12 months or until an entirely new nail has grown out.  The most effective topicals are medical grade medications available through physicians offices. 3. Oral antifungal medications.  With an 80-90% cure rate, the most common  oral medication requires 3 to 4 months of therapy and stays in your system for a year as the new nail grows out.  Oral antifungal medications do require blood work to make sure it is a safe drug for you.  A liver function panel will be performed prior to starting the medication and after the first month of treatment.  It is important to have the blood work performed to avoid any harmful side effects.  In general, this medication safe but blood work is required. 4. Laser Therapy.  This treatment is performed by applying a specialized laser to the affected nail plate.  This therapy is noninvasive, fast, and non-painful.  It is not covered by insurance and is therefore, out of pocket.  The results have been very good with a 80-95% cure rate.  [facility] is the only practice in the area to offer this therapy. 5. Permanent Nail Avulsion.  Removing the entire nail so that a new nail will not grow back.  For normal skin: Moisturize feet once daily; do not apply between toes A.  CeraVe Daily Moisturizing Lotion B.  Vaseline Intensive Care Lotion C.  Lubriderm Lotion D.  Gold Bond Diabetic Foot Lotion E.  Eucerin Intensive Repair Moisturizing Lotion  For extremely dry, cracked feet: moisturize feet once daily; do not apply between toes A. CeraVe Healing Ointment B. Aquaphor Healing Ointment C. Vaseline Petroleum Healing Jelly   If you have problems reaching your feet: apply to feet once daily; do  not apply between toes A.  Aquaphor Advanced Therapy Ointment Body Spray B.  Vaseline Intensive Care Spray Lotion Advanced Repair   Diabetes Mellitus and Foot Care Foot care is an important part of your health, especially when you have diabetes. Diabetes may cause you to have problems because of poor blood flow (circulation) to your feet and legs, which can cause your skin to:  Become thinner and drier.  Break more easily.  Heal more slowly.  Peel and crack. You may also have nerve damage  (neuropathy) in your legs and feet, causing decreased feeling in them. This means that you may not notice minor injuries to your feet that could lead to more serious problems. Noticing and addressing any potential problems early is the best way to prevent future foot problems. How to care for your feet Foot hygiene  Wash your feet daily with warm water and mild soap. Do not use hot water. Then, pat your feet and the areas between your toes until they are completely dry. Do not soak your feet as this can dry your skin.  Trim your toenails straight across. Do not dig under them or around the cuticle. File the edges of your nails with an emery board or nail file.  Apply a moisturizing lotion or petroleum jelly to the skin on your feet and to dry, brittle toenails. Use lotion that does not contain alcohol and is unscented. Do not apply lotion between your toes.   Shoes and socks  Wear clean socks or stockings every day. Make sure they are not too tight. Do not wear knee-high stockings since they may decrease blood flow to your legs.  Wear shoes that fit properly and have enough cushioning. Always look in your shoes before you put them on to be sure there are no objects inside.  To break in new shoes, wear them for just a few hours a day. This prevents injuries on your feet. Wounds, scrapes, corns, and calluses  Check your feet daily for blisters, cuts, bruises, sores, and redness. If you cannot see the bottom of your feet, use a mirror or ask someone for help.  Do not cut corns or calluses or try to remove them with medicine.  If you find a minor scrape, cut, or break in the skin on your feet, keep it and the skin around it clean and dry. You may clean these areas with mild soap and water. Do not clean the area with peroxide, alcohol, or iodine.  If you have a wound, scrape, corn, or callus on your foot, look at it several times a day to make sure it is healing and not infected. Check  for: ? Redness, swelling, or pain. ? Fluid or blood. ? Warmth. ? Pus or a bad smell.   General tips  Do not cross your legs. This may decrease blood flow to your feet.  Do not use heating pads or hot water bottles on your feet. They may burn your skin. If you have lost feeling in your feet or legs, you may not know this is happening until it is too late.  Protect your feet from hot and cold by wearing shoes, such as at the beach or on hot pavement.  Schedule a complete foot exam at least once a year (annually) or more often if you have foot problems. Report any cuts, sores, or bruises to your health care provider immediately. Where to find more information  American Diabetes Association: www.diabetes.org  Association of Diabetes  Care & Education Specialists: www.diabeteseducator.org Contact a health care provider if:  You have a medical condition that increases your risk of infection and you have any cuts, sores, or bruises on your feet.  You have an injury that is not healing.  You have redness on your legs or feet.  You feel burning or tingling in your legs or feet.  You have pain or cramps in your legs and feet.  Your legs or feet are numb.  Your feet always feel cold.  You have pain around any toenails. Get help right away if:  You have a wound, scrape, corn, or callus on your foot and: ? You have pain, swelling, or redness that gets worse. ? You have fluid or blood coming from the wound, scrape, corn, or callus. ? Your wound, scrape, corn, or callus feels warm to the touch. ? You have pus or a bad smell coming from the wound, scrape, corn, or callus. ? You have a fever. ? You have a red line going up your leg. Summary  Check your feet every day for blisters, cuts, bruises, sores, and redness.  Apply a moisturizing lotion or petroleum jelly to the skin on your feet and to dry, brittle toenails.  Wear shoes that fit properly and have enough cushioning.  If you  have foot problems, report any cuts, sores, or bruises to your health care provider immediately.  Schedule a complete foot exam at least once a year (annually) or more often if you have foot problems. This information is not intended to replace advice given to you by your health care provider. Make sure you discuss any questions you have with your health care provider. Document Revised: 04/15/2020 Document Reviewed: 04/15/2020 Elsevier Patient Education  [place].

## 2021-01-03 DIAGNOSIS — I10 Essential (primary) hypertension: Secondary | ICD-10-CM | POA: Diagnosis not present

## 2021-01-03 DIAGNOSIS — E7800 Pure hypercholesterolemia, unspecified: Secondary | ICD-10-CM | POA: Diagnosis not present

## 2021-01-03 DIAGNOSIS — Z7984 Long term (current) use of oral hypoglycemic drugs: Secondary | ICD-10-CM | POA: Diagnosis not present

## 2021-01-03 DIAGNOSIS — E113293 Type 2 diabetes mellitus with mild nonproliferative diabetic retinopathy without macular edema, bilateral: Secondary | ICD-10-CM | POA: Diagnosis not present

## 2021-01-24 ENCOUNTER — Inpatient Hospital Stay: Admission: RE | Admit: 2021-01-24 | Payer: Medicare Other | Source: Ambulatory Visit

## 2021-02-04 DIAGNOSIS — E1165 Type 2 diabetes mellitus with hyperglycemia: Secondary | ICD-10-CM | POA: Diagnosis not present

## 2021-02-04 DIAGNOSIS — E782 Mixed hyperlipidemia: Secondary | ICD-10-CM | POA: Diagnosis not present

## 2021-02-04 DIAGNOSIS — E7800 Pure hypercholesterolemia, unspecified: Secondary | ICD-10-CM | POA: Diagnosis not present

## 2021-02-04 DIAGNOSIS — I10 Essential (primary) hypertension: Secondary | ICD-10-CM | POA: Diagnosis not present

## 2021-03-04 DIAGNOSIS — E1165 Type 2 diabetes mellitus with hyperglycemia: Secondary | ICD-10-CM | POA: Diagnosis not present

## 2021-03-04 DIAGNOSIS — E782 Mixed hyperlipidemia: Secondary | ICD-10-CM | POA: Diagnosis not present

## 2021-03-04 DIAGNOSIS — I10 Essential (primary) hypertension: Secondary | ICD-10-CM | POA: Diagnosis not present

## 2021-03-04 DIAGNOSIS — E7800 Pure hypercholesterolemia, unspecified: Secondary | ICD-10-CM | POA: Diagnosis not present

## 2021-03-14 ENCOUNTER — Ambulatory Visit
Admission: RE | Admit: 2021-03-14 | Discharge: 2021-03-14 | Disposition: A | Discharge status: Home / Self Care | Payer: Medicare Other | Source: Ambulatory Visit | Attending: Internal Medicine | Admitting: Internal Medicine

## 2021-03-14 ENCOUNTER — Other Ambulatory Visit: Payer: Self-pay

## 2021-03-14 DIAGNOSIS — Z1231 Encounter for screening mammogram for malignant neoplasm of breast: Secondary | ICD-10-CM

## 2021-03-31 ENCOUNTER — Ambulatory Visit: Payer: Medicare Other | Admitting: Podiatry

## 2021-03-31 ENCOUNTER — Encounter: Payer: Self-pay | Admitting: Podiatry

## 2021-03-31 ENCOUNTER — Other Ambulatory Visit: Payer: Self-pay

## 2021-03-31 DIAGNOSIS — E1142 Type 2 diabetes mellitus with diabetic polyneuropathy: Secondary | ICD-10-CM

## 2021-03-31 DIAGNOSIS — M79675 Pain in left toe(s): Secondary | ICD-10-CM

## 2021-03-31 DIAGNOSIS — B351 Tinea unguium: Secondary | ICD-10-CM

## 2021-03-31 DIAGNOSIS — M79674 Pain in right toe(s): Secondary | ICD-10-CM

## 2021-03-31 NOTE — Progress Notes (Signed)
  Subjective:  Patient ID: Evelyn Davis, female    DOB: January 27, 1982,  MRN: [id number]  80 y.o. female presents at risk foot care with history of diabetic neuropathy and painful thick toenails that are difficult to trim. Pain interferes with ambulation. Aggravating factors include wearing enclosed shoe gear. Pain is relieved with periodic professional debridement.  Patient states blood glucose was 134 mg/dl today. She does not remember her last A1c. She continues to have numbness in her feet. She denies any new pedal concerns on today's visit.  PCP is Dr. Seward Carol and last visit was 4 months ago per patient recall.  Allergies  Allergen Reactions   Penicillins Rash    Review of Systems: Negative except as noted in the HPI.   Objective:   Constitutional Pt is a pleasant 80 y.o. African American female obese in NAD. AAO x 3.   Vascular Capillary refill time to digits immediate b/l. Palpable DP pulse(s) b/l lower extremities Palpable PT pulse(s) b/l lower extremities Pedal hair sparse. Lower extremity skin temperature gradient within normal limits. Nonpitting edema noted b/l lower extremities.  Neurologic Pt has subjective symptoms of neuropathy. Protective sensation intact 5/5 intact bilaterally with 10g monofilament b/l. Vibratory sensation decreased b/l.  Dermatologic Toenails 1-5 b/l elongated, discolored, dystrophic, thickened, crumbly with subungual debris and tenderness to dorsal palpation.  Orthopedic: Normal muscle strength 5/5 to all lower extremity muscle groups bilaterally. No pain crepitus or joint limitation noted with ROM b/l. Hammertoe(s) noted to the L 5th toe and R 5th toe.   Radiographs: None Assessment:   1. Pain due to onychomycosis of toenails of both feet   2. Diabetic peripheral neuropathy associated with type 2 diabetes mellitus ([place])    Plan:  Patient was evaluated and treated and all questions answered.  Onychomycosis with pain -Nails palliatively  debridement as below. -Educated on self-care  Procedure: Nail Debridement Rationale: Pain Type of Debridement: manual, sharp debridement. Instrumentation: Nail nipper, rotary burr. Number of Nails: 10  -Examined patient. -Continue diabetic foot care principles. -Patient to continue soft, supportive shoe gear daily. -Toenails 1-5 b/l were debrided in length and girth with sterile nail nippers and dremel without iatrogenic bleeding.  -Patient to report any pedal injuries to medical professional immediately. -Patient/POA to call should there be question/concern in the interim.  Return in about 3 months (around 07/01/2021).  Marzetta Board, DPM

## 2021-04-14 DIAGNOSIS — E113293 Type 2 diabetes mellitus with mild nonproliferative diabetic retinopathy without macular edema, bilateral: Secondary | ICD-10-CM | POA: Diagnosis not present

## 2021-04-26 DIAGNOSIS — E7800 Pure hypercholesterolemia, unspecified: Secondary | ICD-10-CM | POA: Diagnosis not present

## 2021-04-26 DIAGNOSIS — E782 Mixed hyperlipidemia: Secondary | ICD-10-CM | POA: Diagnosis not present

## 2021-04-26 DIAGNOSIS — I10 Essential (primary) hypertension: Secondary | ICD-10-CM | POA: Diagnosis not present

## 2021-04-26 DIAGNOSIS — E1165 Type 2 diabetes mellitus with hyperglycemia: Secondary | ICD-10-CM | POA: Diagnosis not present

## 2021-05-11 DIAGNOSIS — E1165 Type 2 diabetes mellitus with hyperglycemia: Secondary | ICD-10-CM | POA: Diagnosis not present

## 2021-05-11 DIAGNOSIS — E7800 Pure hypercholesterolemia, unspecified: Secondary | ICD-10-CM | POA: Diagnosis not present

## 2021-05-11 DIAGNOSIS — I10 Essential (primary) hypertension: Secondary | ICD-10-CM | POA: Diagnosis not present

## 2021-05-11 DIAGNOSIS — E782 Mixed hyperlipidemia: Secondary | ICD-10-CM | POA: Diagnosis not present

## 2021-07-14 ENCOUNTER — Ambulatory Visit: Payer: Medicare Other | Admitting: Podiatry

## 2021-07-14 ENCOUNTER — Encounter: Payer: Self-pay | Admitting: Podiatry

## 2021-07-14 ENCOUNTER — Other Ambulatory Visit: Payer: Self-pay

## 2021-07-14 DIAGNOSIS — F4320 Adjustment disorder, unspecified: Secondary | ICD-10-CM | POA: Insufficient documentation

## 2021-07-14 DIAGNOSIS — B351 Tinea unguium: Secondary | ICD-10-CM

## 2021-07-14 DIAGNOSIS — M79674 Pain in right toe(s): Secondary | ICD-10-CM | POA: Diagnosis not present

## 2021-07-14 DIAGNOSIS — E1142 Type 2 diabetes mellitus with diabetic polyneuropathy: Secondary | ICD-10-CM

## 2021-07-14 DIAGNOSIS — M79675 Pain in left toe(s): Secondary | ICD-10-CM

## 2021-07-14 DIAGNOSIS — E663 Overweight: Secondary | ICD-10-CM | POA: Insufficient documentation

## 2021-07-14 DIAGNOSIS — R600 Localized edema: Secondary | ICD-10-CM

## 2021-07-14 NOTE — Patient Instructions (Signed)
Compression Stockings:  [facility] [place], [place] [address]  [phone]   Edema Edema is an abnormal buildup of fluids in the body tissues and under the skin. Swelling of the legs, feet, and ankles is a common symptom that becomes more likely as you get older. Swelling is also common in looser tissues, like around the eyes. When the affected area is squeezed, the fluid may move out of that spot and leave a dent for a few moments. This dent is called pitting edema. There are many possible causes of edema. Eating too much salt (sodium) and being on your feet or sitting for a long time can cause edema in your legs, feet, and ankles. Hot weather may make edema worse. Common causes of edema include: Heart failure. Liver or kidney disease. Weak leg blood vessels. Cancer. An injury. Pregnancy. Medicines. Being obese. Low protein levels in the blood. Edema is usually painless. Your skin may look swollen or shiny. Follow these instructions at home: Keep the affected body part raised (elevated) above the level of your heart when you are sitting or lying down. Do not sit still or stand for long periods of time. Do not wear tight clothing. Do not wear garters on your upper legs. Exercise your legs to get your circulation going. This helps to move the fluid back into your blood vessels, and it may help the swelling go down. Wear elastic bandages or support stockings to reduce swelling as told by your health care provider. Eat a low-salt (low-sodium) diet to reduce fluid as told by your health care provider. Depending on the cause of your swelling, you may need to limit how much fluid you drink (fluid restriction). Take over-the-counter and prescription medicines only as told by your health care provider. Contact a health care provider if: Your edema does not get better with treatment. You have heart, liver, or kidney disease and have symptoms of  edema. You have sudden and unexplained weight gain. Get help right away if: You develop shortness of breath or chest pain. You cannot breathe when you lie down. You develop pain, redness, or warmth in the swollen areas. You have heart, liver, or kidney disease and suddenly get edema. You have a fever and your symptoms suddenly get worse. Summary Edema is an abnormal buildup of fluids in the body tissues and under the skin. Eating too much salt (sodium) and being on your feet or sitting for a long time can cause edema in your legs, feet, and ankles. Keep the affected body part raised (elevated) above the level of your heart when you are sitting or lying down. This information is not intended to replace advice given to you by your health care provider. Make sure you discuss any questions you have with your health care provider. Document Revised: 08/03/2020 Document Reviewed: 07/20/2020 Elsevier Patient Education  2022 [employer].

## 2021-07-20 NOTE — Progress Notes (Signed)
  Subjective:  Patient ID: Evelyn Davis, female    DOB: [birth year],  MRN: [id number]  80 y.o. female presents with at risk foot care with history of diabetic neuropathy and thick, elongated toenails b/l feet which are tender when wearing enclosed shoe gear..    Patient's blood sugar was 128 mg/dl today.   PCP: Seward Carol, MD and last visit was: 0527/2022.  She would like to discuss compression hose for her feet.  Review of Systems: Negative except as noted in the HPI.   Allergies  Allergen Reactions   Penicillins Rash    Objective:  There were no vitals filed for this visit. Constitutional Patient is a pleasant 80 y.o. African American female in NAD. AAO x 3.  Vascular Capillary fill time to digits immediate b/l.  DP/PT pulse(s) are palpable b/l lower extremities. Pedal hair sparse. Lower extremity skin temperature gradient within normal limits. No pain with calf compression b/l. Trace edema noted b/l lower extremities. No cyanosis or clubbing noted.   Neurologic Protective sensation intact 5/5 intact bilaterally with 10g monofilament b/l. Vibratory sensation intact b/l. No clonus b/l. Pt has subjective symptoms of neuropathy.  Dermatologic Skin warm and supple b/l lower extremities. No open wounds b/l LE. No interdigital macerations b/l lower extremities. Toenails 1-5 b/l elongated, discolored, dystrophic, thickened, crumbly with subungual debris and tenderness to dorsal palpation.  Orthopedic: Normal muscle strength 5/5 to all lower extremity muscle groups bilaterally. Hammertoe(s) noted to the L 5th toe and R 5th toe.    Assessment:   1. Pain due to onychomycosis of toenails of both feet   2. Localized edema   3. Diabetic peripheral neuropathy associated with type 2 diabetes mellitus ([place])    Plan:  Patient was evaluated and treated and all questions answered. Consent given for treatment as described below: -Examined patient. -Discussed pedal edema and recommended  compression hose. Patient given information for [facility] in [place]. She may call and schedule a fitting appointment. -Patient to continue soft, supportive shoe gear daily. -Toenails 1-5 b/l were debrided in length and girth with sterile nail nippers and dremel without iatrogenic bleeding.  -Patient to report any pedal injuries to medical professional immediately. -Patient/POA to call should there be question/concern in the interim.  Return in about 3 months (around 10/14/2021).  Marzetta Board, DPM

## 2021-08-02 DIAGNOSIS — E1165 Type 2 diabetes mellitus with hyperglycemia: Secondary | ICD-10-CM | POA: Diagnosis not present

## 2021-08-02 DIAGNOSIS — E782 Mixed hyperlipidemia: Secondary | ICD-10-CM | POA: Diagnosis not present

## 2021-08-02 DIAGNOSIS — E7800 Pure hypercholesterolemia, unspecified: Secondary | ICD-10-CM | POA: Diagnosis not present

## 2021-08-02 DIAGNOSIS — I10 Essential (primary) hypertension: Secondary | ICD-10-CM | POA: Diagnosis not present

## 2021-08-30 DIAGNOSIS — E0840 Diabetes mellitus due to underlying condition with diabetic neuropathy, unspecified: Secondary | ICD-10-CM | POA: Diagnosis not present

## 2021-08-30 DIAGNOSIS — E7800 Pure hypercholesterolemia, unspecified: Secondary | ICD-10-CM | POA: Diagnosis not present

## 2021-08-30 DIAGNOSIS — Z0000 Encounter for general adult medical examination without abnormal findings: Secondary | ICD-10-CM | POA: Diagnosis not present

## 2021-08-30 DIAGNOSIS — Z7984 Long term (current) use of oral hypoglycemic drugs: Secondary | ICD-10-CM | POA: Diagnosis not present

## 2021-08-30 DIAGNOSIS — M545 Low back pain, unspecified: Secondary | ICD-10-CM | POA: Diagnosis not present

## 2021-08-30 DIAGNOSIS — G4733 Obstructive sleep apnea (adult) (pediatric): Secondary | ICD-10-CM | POA: Diagnosis not present

## 2021-08-30 DIAGNOSIS — M25569 Pain in unspecified knee: Secondary | ICD-10-CM | POA: Diagnosis not present

## 2021-08-30 DIAGNOSIS — E113293 Type 2 diabetes mellitus with mild nonproliferative diabetic retinopathy without macular edema, bilateral: Secondary | ICD-10-CM | POA: Diagnosis not present

## 2021-08-30 DIAGNOSIS — I10 Essential (primary) hypertension: Secondary | ICD-10-CM | POA: Diagnosis not present

## 2021-09-05 DIAGNOSIS — E0840 Diabetes mellitus due to underlying condition with diabetic neuropathy, unspecified: Secondary | ICD-10-CM | POA: Diagnosis not present

## 2021-09-05 DIAGNOSIS — E7800 Pure hypercholesterolemia, unspecified: Secondary | ICD-10-CM | POA: Diagnosis not present

## 2021-09-05 DIAGNOSIS — E1165 Type 2 diabetes mellitus with hyperglycemia: Secondary | ICD-10-CM | POA: Diagnosis not present

## 2021-09-05 DIAGNOSIS — I10 Essential (primary) hypertension: Secondary | ICD-10-CM | POA: Diagnosis not present

## 2021-09-05 DIAGNOSIS — E782 Mixed hyperlipidemia: Secondary | ICD-10-CM | POA: Diagnosis not present

## 2021-09-06 ENCOUNTER — Other Ambulatory Visit: Payer: Self-pay | Admitting: Internal Medicine

## 2021-09-06 DIAGNOSIS — E2839 Other primary ovarian failure: Secondary | ICD-10-CM

## 2021-11-01 DIAGNOSIS — E1165 Type 2 diabetes mellitus with hyperglycemia: Secondary | ICD-10-CM | POA: Diagnosis not present

## 2021-11-01 DIAGNOSIS — E7800 Pure hypercholesterolemia, unspecified: Secondary | ICD-10-CM | POA: Diagnosis not present

## 2021-11-01 DIAGNOSIS — I10 Essential (primary) hypertension: Secondary | ICD-10-CM | POA: Diagnosis not present

## 2021-11-10 ENCOUNTER — Encounter: Payer: Self-pay | Admitting: Podiatry

## 2021-11-10 ENCOUNTER — Ambulatory Visit: Payer: Medicare Other | Admitting: Podiatry

## 2021-11-10 DIAGNOSIS — E2839 Other primary ovarian failure: Secondary | ICD-10-CM | POA: Insufficient documentation

## 2021-11-10 DIAGNOSIS — L608 Other nail disorders: Secondary | ICD-10-CM | POA: Diagnosis not present

## 2021-11-10 DIAGNOSIS — M79675 Pain in left toe(s): Secondary | ICD-10-CM | POA: Diagnosis not present

## 2021-11-10 DIAGNOSIS — E1142 Type 2 diabetes mellitus with diabetic polyneuropathy: Secondary | ICD-10-CM

## 2021-11-10 DIAGNOSIS — B351 Tinea unguium: Secondary | ICD-10-CM

## 2021-11-10 DIAGNOSIS — E119 Type 2 diabetes mellitus without complications: Secondary | ICD-10-CM | POA: Diagnosis not present

## 2021-11-10 DIAGNOSIS — E1140 Type 2 diabetes mellitus with diabetic neuropathy, unspecified: Secondary | ICD-10-CM | POA: Insufficient documentation

## 2021-11-10 DIAGNOSIS — M79674 Pain in right toe(s): Secondary | ICD-10-CM | POA: Diagnosis not present

## 2021-11-15 NOTE — Progress Notes (Signed)
Subjective: Evelyn Davis is a pleasant 81 y.o. female patient seen today for at risk foot care with history of diabetic neuropathy and painful elongated mycotic toenails 1-5 bilaterally which are tender when wearing enclosed shoe gear. Pain is relieved with periodic professional debridement.   Patient states their blood glucose was 132 mg/dl today.   She is accompanied by her daughter on today's visit.  PCP is Seward Carol, MD. Last visit was: 09/14/2021.  Allergies  Allergen Reactions   Penicillins Rash    Objective: Physical Exam  General: Evelyn Davis is a pleasant 81 y.o. African American female, WD, WN in NAD. AAO x 3.   Vascular:  CFT immediate b/l LE. Palpable DP/PT pulses b/l LE. Digital hair sparse b/l. Skin temperature gradient WNL b/l. No pain with calf compression b/l. No edema noted b/l. No cyanosis or clubbing noted b/l LE.   Dermatological:  Pedal integument with normal turgor, texture and tone b/l LE. No open wounds b/l. No interdigital macerations b/l. Toenails 1-5 b/l elongated, thickened, discolored with subungual debris. +Tenderness with dorsal palpation of nailplates. No hyperkeratotic or porokeratotic lesions present. Two areas of melanonychia noted left great toe, both are longitudinal  lines of hyperpigmentation. Questionable extension into proximal nailfold.   Musculoskeletal:  Normal muscle strength 5/5 to all lower extremity muscle groups bilaterally. Hammertoe(s) noted to the bilateral 5th toes.. No pain, crepitus or joint limitation noted with ROM b/l LE.  Patient ambulates independently without assistive aids.   Neurological:  Pt has subjective symptoms of neuropathy. Protective sensation intact 5/5 intact bilaterally with 10g monofilament b/l.   Assessment and Plan:  1. Pain due to onychomycosis of toenails of both feet   2. Longitudinal melanonychia   3. Diabetic peripheral neuropathy associated with type 2 diabetes mellitus ([place])      Patient was evaluated and treated and all questions answered. Consent given for treatment as described below: -For melanonychia of great toe, recommended Dermatology Consultation. Patient agrees and will schedule at her convenience. -Mycotic toenails 1-5 bilaterally were debrided in length and girth with sterile nail nippers and dremel without incident. -Patient/POA to call should there be question/concern in the interim.  Return in about 3 months (around 02/07/2022).  Marzetta Board, DPM

## 2021-11-21 DIAGNOSIS — E782 Mixed hyperlipidemia: Secondary | ICD-10-CM | POA: Diagnosis not present

## 2021-11-21 DIAGNOSIS — I10 Essential (primary) hypertension: Secondary | ICD-10-CM | POA: Diagnosis not present

## 2021-11-21 DIAGNOSIS — E1165 Type 2 diabetes mellitus with hyperglycemia: Secondary | ICD-10-CM | POA: Diagnosis not present

## 2022-01-02 DIAGNOSIS — I10 Essential (primary) hypertension: Secondary | ICD-10-CM | POA: Diagnosis not present

## 2022-01-02 DIAGNOSIS — E1165 Type 2 diabetes mellitus with hyperglycemia: Secondary | ICD-10-CM | POA: Diagnosis not present

## 2022-01-02 DIAGNOSIS — E782 Mixed hyperlipidemia: Secondary | ICD-10-CM | POA: Diagnosis not present

## 2022-01-12 DIAGNOSIS — E113293 Type 2 diabetes mellitus with mild nonproliferative diabetic retinopathy without macular edema, bilateral: Secondary | ICD-10-CM | POA: Diagnosis not present

## 2022-01-12 DIAGNOSIS — R197 Diarrhea, unspecified: Secondary | ICD-10-CM | POA: Diagnosis not present

## 2022-01-12 DIAGNOSIS — R110 Nausea: Secondary | ICD-10-CM | POA: Diagnosis not present

## 2022-01-20 DIAGNOSIS — D485 Neoplasm of uncertain behavior of skin: Secondary | ICD-10-CM | POA: Diagnosis not present

## 2022-01-20 DIAGNOSIS — L603 Nail dystrophy: Secondary | ICD-10-CM | POA: Diagnosis not present

## 2022-01-20 DIAGNOSIS — L918 Other hypertrophic disorders of the skin: Secondary | ICD-10-CM | POA: Diagnosis not present

## 2022-01-20 DIAGNOSIS — B079 Viral wart, unspecified: Secondary | ICD-10-CM | POA: Diagnosis not present

## 2022-01-25 DIAGNOSIS — I10 Essential (primary) hypertension: Secondary | ICD-10-CM | POA: Diagnosis not present

## 2022-01-25 DIAGNOSIS — E782 Mixed hyperlipidemia: Secondary | ICD-10-CM | POA: Diagnosis not present

## 2022-01-25 DIAGNOSIS — E1165 Type 2 diabetes mellitus with hyperglycemia: Secondary | ICD-10-CM | POA: Diagnosis not present

## 2022-02-07 ENCOUNTER — Other Ambulatory Visit: Payer: Self-pay | Admitting: Internal Medicine

## 2022-02-07 DIAGNOSIS — I10 Essential (primary) hypertension: Secondary | ICD-10-CM | POA: Diagnosis not present

## 2022-02-07 DIAGNOSIS — E782 Mixed hyperlipidemia: Secondary | ICD-10-CM | POA: Diagnosis not present

## 2022-02-07 DIAGNOSIS — E0840 Diabetes mellitus due to underlying condition with diabetic neuropathy, unspecified: Secondary | ICD-10-CM | POA: Diagnosis not present

## 2022-02-07 DIAGNOSIS — Z1231 Encounter for screening mammogram for malignant neoplasm of breast: Secondary | ICD-10-CM

## 2022-02-09 ENCOUNTER — Ambulatory Visit: Payer: Medicare Other | Admitting: Podiatry

## 2022-02-16 ENCOUNTER — Ambulatory Visit: Payer: Medicare Other | Admitting: Podiatry

## 2022-02-16 ENCOUNTER — Encounter: Payer: Self-pay | Admitting: Podiatry

## 2022-02-16 VITALS — BP 140/88 | HR 68 | Temp 97.2°F | Resp 14

## 2022-02-16 DIAGNOSIS — L03115 Cellulitis of right lower limb: Secondary | ICD-10-CM | POA: Diagnosis not present

## 2022-02-16 DIAGNOSIS — B351 Tinea unguium: Secondary | ICD-10-CM

## 2022-02-16 DIAGNOSIS — B353 Tinea pedis: Secondary | ICD-10-CM

## 2022-02-16 DIAGNOSIS — R600 Localized edema: Secondary | ICD-10-CM | POA: Diagnosis not present

## 2022-02-16 DIAGNOSIS — E119 Type 2 diabetes mellitus without complications: Secondary | ICD-10-CM

## 2022-02-16 DIAGNOSIS — M7989 Other specified soft tissue disorders: Secondary | ICD-10-CM | POA: Diagnosis not present

## 2022-02-16 DIAGNOSIS — E1142 Type 2 diabetes mellitus with diabetic polyneuropathy: Secondary | ICD-10-CM

## 2022-02-16 DIAGNOSIS — L03116 Cellulitis of left lower limb: Secondary | ICD-10-CM | POA: Diagnosis not present

## 2022-02-16 NOTE — Progress Notes (Addendum)
?Subjective:  ?Patient ID: Evelyn Davis, female    DOB: 1941/08/02,  MRN: [id number] ? ?Evelyn Davis presents to clinic today for for diabetic foot evaluation and painful elongated mycotic toenails 1-5 bilaterally which are tender when wearing enclosed shoe gear. Pain is relieved with periodic professional debridement. ? ?Patient states she did go to [facility] for evaluation of melanonychia of left great toe and was told it was benign. She also is applying antifungal cream OTC prescribed by [facility]. ? ?Patient states blood glucose was 159 mg/dl today  ? ?New problem(s):  Patient unaware of redness of RLE from ankle area to proximal tibia. She states she has not worn her compression hose since last Friday . She denies any fever, chills, night sweats, nausea or vomiting. ? ?PCP is Seward Carol, MD , and last visit was January 16, 2022. ? ?Allergies  ?Allergen Reactions  ? Penicillins Rash  ? ? ?Review of Systems: Negative except as noted in the HPI. ? ?Objective:  ?Vitals:  ? 02/16/22 1207  ?BP: 140/88  ?Pulse: 68  ?Resp: 14  ?Temp: (!) 97.2 ?F (36.2 ?C)  ?  ?General: Evelyn Davis is a pleasant 81 y.o. African American female, obese, in NAD. AAO x 3.  ? ?Vascular:  ?CFT immediate b/l LE. Palpable DP/PT pulses b/l LE. Digital hair sparse b/l. Skin temperature with increased warmth noted to right lower extremity compared to contralateral limb. Both legs have nonpitting edema. No pain with calf compression b/l. No ischemia nor gangrene noted. No cyanosis or clubbing noted b/l LE.  ? ?Dermatological:  ? ? ? ? ? ? ? ?Pedal integument with normal turgor, texture and tone b/l LE. No open wounds b/l. No interdigital macerations b/l. Toenails 1-5 b/l elongated, thickened, discolored with subungual debris. +Tenderness with dorsal palpation of nailplates. No hyperkeratotic or porokeratotic lesions present. Diffuse scaling noted peripherally and plantarly b/l feet.  Mild foot  odor. No interdigital macerations.  No blisters, no weeping. No signs of secondary bacterial infection noted.  ? ?She has questionable blister noted lateral ankle area with erythema extending proximally to the anterior aspect of the tibia. ? ?Musculoskeletal:  ?Normal muscle strength 5/5 to all lower extremity muscle groups bilaterally. Hammertoe(s) noted to the bilateral 5th toes. No pain, crepitus or joint limitation noted with ROM b/l LE.  Patient ambulates independently without assistive aids. No pain with calf compression of RLE. ? ?Neurological:  ?Pt has subjective symptoms of neuropathy. Protective sensation intact 5/5 intact bilaterally with 10g monofilament b/l.  ? ?Assessment/Plan: ?1. Pain due to onychomycosis of toenails of both feet   ?2. Cellulitis of right lower extremity   ?3. Tinea pedis of both feet   ?4. Diabetic peripheral neuropathy associated with type 2 diabetes mellitus ([place])   ?5. Encounter for diabetic foot exam ([place])   ?  ?-Patient was evaluated and treated. All patient's and/or POA's questions/concerns answered on today's visit. ?-We discussed need for medical intervention. Rule out DVT vs cellulitis. She did call Dr. Lina Sar office for an appointment, but it was lunch time. Patient has opted to go to [facility] for evaluation. ?-Her tinea pedis is being managed by [facility]. ?-Continue foot and shoe inspections daily. Monitor blood glucose per PCP/Endocrinologist's recommendations. ?-Mycotic toenails 1-5 bilaterally were debrided in length and girth with sterile nail nippers and dremel without incident. ?-Patient/POA to call should there be question/concern in the interim.  ? ?Return in about 3 months (around 05/19/2022). ? ?Stephani Police  Elisha Ponder, DPM  ?

## 2022-02-22 ENCOUNTER — Ambulatory Visit
Admission: RE | Admit: 2022-02-22 | Discharge: 2022-02-22 | Disposition: A | Discharge status: Home / Self Care | Payer: Medicare Other | Source: Ambulatory Visit | Attending: Internal Medicine | Admitting: Internal Medicine

## 2022-02-22 DIAGNOSIS — Z780 Asymptomatic menopausal state: Secondary | ICD-10-CM | POA: Diagnosis not present

## 2022-02-22 DIAGNOSIS — E2839 Other primary ovarian failure: Secondary | ICD-10-CM

## 2022-03-02 DIAGNOSIS — R600 Localized edema: Secondary | ICD-10-CM | POA: Diagnosis not present

## 2022-03-02 DIAGNOSIS — G4733 Obstructive sleep apnea (adult) (pediatric): Secondary | ICD-10-CM | POA: Diagnosis not present

## 2022-03-02 DIAGNOSIS — E8770 Fluid overload, unspecified: Secondary | ICD-10-CM | POA: Diagnosis not present

## 2022-03-02 DIAGNOSIS — Z794 Long term (current) use of insulin: Secondary | ICD-10-CM | POA: Diagnosis not present

## 2022-03-02 DIAGNOSIS — E11319 Type 2 diabetes mellitus with unspecified diabetic retinopathy without macular edema: Secondary | ICD-10-CM | POA: Diagnosis not present

## 2022-03-02 DIAGNOSIS — I10 Essential (primary) hypertension: Secondary | ICD-10-CM | POA: Diagnosis not present

## 2022-03-02 DIAGNOSIS — E7800 Pure hypercholesterolemia, unspecified: Secondary | ICD-10-CM | POA: Diagnosis not present

## 2022-03-02 DIAGNOSIS — E1165 Type 2 diabetes mellitus with hyperglycemia: Secondary | ICD-10-CM | POA: Diagnosis not present

## 2022-03-15 ENCOUNTER — Ambulatory Visit: Payer: Medicare Other

## 2022-04-21 DIAGNOSIS — R6884 Jaw pain: Secondary | ICD-10-CM | POA: Diagnosis not present

## 2022-05-09 ENCOUNTER — Ambulatory Visit
Admission: RE | Admit: 2022-05-09 | Discharge: 2022-05-09 | Disposition: A | Discharge status: Home / Self Care | Payer: Medicare Other | Source: Ambulatory Visit | Attending: Internal Medicine | Admitting: Internal Medicine

## 2022-05-09 DIAGNOSIS — Z1231 Encounter for screening mammogram for malignant neoplasm of breast: Secondary | ICD-10-CM | POA: Diagnosis not present

## 2022-05-25 ENCOUNTER — Ambulatory Visit: Payer: Medicare Other | Admitting: Podiatry

## 2022-06-01 ENCOUNTER — Ambulatory Visit: Payer: Medicare Other | Admitting: Podiatry

## 2022-06-01 ENCOUNTER — Encounter: Payer: Self-pay | Admitting: Podiatry

## 2022-06-01 DIAGNOSIS — E1142 Type 2 diabetes mellitus with diabetic polyneuropathy: Secondary | ICD-10-CM

## 2022-06-01 DIAGNOSIS — B351 Tinea unguium: Secondary | ICD-10-CM

## 2022-06-01 DIAGNOSIS — M79674 Pain in right toe(s): Secondary | ICD-10-CM | POA: Diagnosis not present

## 2022-06-01 DIAGNOSIS — M79675 Pain in left toe(s): Secondary | ICD-10-CM | POA: Diagnosis not present

## 2022-06-01 NOTE — Progress Notes (Signed)
  Subjective:  Patient ID: Evelyn Davis, female    DOB: 1941/08/19,  MRN: [id number]  81 y.o. female presents with at risk foot care with history of diabetic neuropathy and painful thick toenails that are difficult to trim. Pain interferes with ambulation. Aggravating factors include wearing enclosed shoe gear. Pain is relieved with periodic professional debridement..    Patient's blood sugar was 125 mg/dl today.  Her last A1c was unknown.  New problem(s): None    PCP: Seward Carol, MD and last visit WKG:SUPJ 81, 2023.  Review of Systems: Negative except as noted in the HPI.   Allergies  Allergen Reactions   Penicillins Rash   Objective:  There were no vitals filed for this visit. Constitutional Patient is a pleasant 81 y.o. African American female WD, WN in NAD. AAO x 3.  Vascular Capillary fill time to digits immediate b/l.  DP/PT pulse(s) are palpable b/l lower extremities. Pedal hair sparse. Lower extremity skin temperature gradient within normal limits. No pain with calf compression b/l.  No cyanosis or clubbing noted. Nonpitting edema noted BLE.  Neurologic Protective sensation intact 5/5 intact bilaterally with 10g monofilament b/l. Vibratory sensation intact b/l. No clonus b/l. Pt has subjective symptoms of neuropathy.  Dermatologic Pedal skin is warm and supple b/l.  No open wounds b/l lower extremities. No interdigital macerations b/l lower extremities. Toenails 1-5 b/l elongated, discolored, dystrophic, thickened, crumbly with subungual debris and tenderness to dorsal palpation.   Orthopedic: Normal muscle strength 5/5 to all lower extremity muscle groups bilaterally. Patient ambulates independent of any assistive aids. Hammertoe(s) noted to the bilateral 5th toes.    Assessment:   1. Pain due to onychomycosis of toenails of both feet   2. Diabetic peripheral neuropathy associated with type 2 diabetes mellitus ([place])    Plan:  Patient was evaluated and treated and all  questions answered. Consent given for treatment as described below: -No new findings. No new orders. -Patient to continue soft, supportive shoe gear daily. -Toenails 1-5 b/l were debrided in length and girth with sterile nail nippers and dremel without iatrogenic bleeding.  -Patient/POA to call should there be question/concern in the interim.  Return in about 3 months (around 09/01/2022).  Marzetta Board, DPM

## 2022-08-24 ENCOUNTER — Encounter: Payer: Self-pay | Admitting: Podiatry

## 2022-08-24 ENCOUNTER — Ambulatory Visit: Payer: Medicare Other | Admitting: Podiatry

## 2022-08-24 DIAGNOSIS — M79675 Pain in left toe(s): Secondary | ICD-10-CM

## 2022-08-24 DIAGNOSIS — E1142 Type 2 diabetes mellitus with diabetic polyneuropathy: Secondary | ICD-10-CM | POA: Diagnosis not present

## 2022-08-24 DIAGNOSIS — B351 Tinea unguium: Secondary | ICD-10-CM

## 2022-08-24 DIAGNOSIS — M79674 Pain in right toe(s): Secondary | ICD-10-CM | POA: Diagnosis not present

## 2022-08-24 NOTE — Progress Notes (Signed)
  Subjective:  Patient ID: Evelyn Davis, female    DOB: 04-07-1941,  MRN: [id number]  Evelyn Davis presents to clinic today for at risk foot care with history of diabetic neuropathy and painful elongated mycotic toenails 1-5 bilaterally which are tender when wearing enclosed shoe gear. Pain is relieved with periodic professional debridement.  Chief Complaint  Patient presents with   Nail Problem    Diabetic foot care BS-113 A1C-7.? PCP-Polite PCP VST-02/2022   New problem(s): None.   PCP is Seward Carol, MD .  Allergies  Allergen Reactions   Penicillins Rash    Review of Systems: Negative except as noted in the HPI.  Objective: No changes noted in today's physical examination.  Evelyn Davis is a pleasant 81 y.o. female WD, WN in NAD. AAO x 3.  Vascular Capillary fill time to digits immediate b/l.  DP/PT pulse(s) are palpable b/l lower extremities. Pedal hair sparse. Lower extremity skin temperature gradient within normal limits. No pain with calf compression b/l.  No cyanosis or clubbing noted. Nonpitting edema noted BLE.  Neurologic Protective sensation intact 5/5 intact bilaterally with 10g monofilament b/l. Vibratory sensation intact b/l. No clonus b/l. Pt has subjective symptoms of neuropathy.  Dermatologic Pedal skin is warm and supple b/l.  No open wounds b/l lower extremities. No interdigital macerations b/l lower extremities. Toenails 1-5 b/l elongated, discolored, dystrophic, thickened, crumbly with subungual debris and tenderness to dorsal palpation.   Orthopedic: Normal muscle strength 5/5 to all lower extremity muscle groups bilaterally. Patient ambulates independent of any assistive aids. Hammertoe(s) noted to the bilateral 5th toes.   Assessment/Plan: 1. Pain due to onychomycosis of toenails of both feet   2. Diabetic peripheral neuropathy associated with type 2 diabetes mellitus ([place])     No orders of the defined types were placed in this encounter.    -Consent given for treatment as described below: -Examined patient. -Continue supportive shoe gear daily. -Mycotic toenails 1-5 bilaterally were debrided in length and girth with sterile nail nippers and dremel without incident. -Patient/POA to call should there be question/concern in the interim.   Return in about 3 months (around 11/24/2022).  Marzetta Board, DPM

## 2022-09-06 DIAGNOSIS — G4733 Obstructive sleep apnea (adult) (pediatric): Secondary | ICD-10-CM | POA: Diagnosis not present

## 2022-11-01 DIAGNOSIS — Z5181 Encounter for therapeutic drug level monitoring: Secondary | ICD-10-CM | POA: Diagnosis not present

## 2022-11-01 DIAGNOSIS — E7800 Pure hypercholesterolemia, unspecified: Secondary | ICD-10-CM | POA: Diagnosis not present

## 2022-11-01 DIAGNOSIS — E0840 Diabetes mellitus due to underlying condition with diabetic neuropathy, unspecified: Secondary | ICD-10-CM | POA: Diagnosis not present

## 2022-11-01 DIAGNOSIS — E119 Type 2 diabetes mellitus without complications: Secondary | ICD-10-CM | POA: Diagnosis not present

## 2022-11-01 DIAGNOSIS — Z0000 Encounter for general adult medical examination without abnormal findings: Secondary | ICD-10-CM | POA: Diagnosis not present

## 2022-11-01 DIAGNOSIS — G4733 Obstructive sleep apnea (adult) (pediatric): Secondary | ICD-10-CM | POA: Diagnosis not present

## 2022-11-01 DIAGNOSIS — Z79899 Other long term (current) drug therapy: Secondary | ICD-10-CM | POA: Diagnosis not present

## 2022-11-01 DIAGNOSIS — I10 Essential (primary) hypertension: Secondary | ICD-10-CM | POA: Diagnosis not present

## 2022-11-01 DIAGNOSIS — I6523 Occlusion and stenosis of bilateral carotid arteries: Secondary | ICD-10-CM | POA: Diagnosis not present

## 2022-11-01 DIAGNOSIS — Z794 Long term (current) use of insulin: Secondary | ICD-10-CM | POA: Diagnosis not present

## 2022-11-01 DIAGNOSIS — E113293 Type 2 diabetes mellitus with mild nonproliferative diabetic retinopathy without macular edema, bilateral: Secondary | ICD-10-CM | POA: Diagnosis not present

## 2022-11-01 DIAGNOSIS — E11319 Type 2 diabetes mellitus with unspecified diabetic retinopathy without macular edema: Secondary | ICD-10-CM | POA: Diagnosis not present

## 2022-11-15 DIAGNOSIS — H524 Presbyopia: Secondary | ICD-10-CM | POA: Diagnosis not present

## 2022-11-15 DIAGNOSIS — H25813 Combined forms of age-related cataract, bilateral: Secondary | ICD-10-CM | POA: Diagnosis not present

## 2022-11-15 DIAGNOSIS — E113292 Type 2 diabetes mellitus with mild nonproliferative diabetic retinopathy without macular edema, left eye: Secondary | ICD-10-CM | POA: Diagnosis not present

## 2022-12-14 ENCOUNTER — Ambulatory Visit: Payer: Medicare Other | Admitting: Podiatry

## 2022-12-15 ENCOUNTER — Ambulatory Visit: Payer: Medicare Other | Admitting: Podiatry

## 2022-12-15 DIAGNOSIS — M79674 Pain in right toe(s): Secondary | ICD-10-CM | POA: Diagnosis not present

## 2022-12-15 DIAGNOSIS — B351 Tinea unguium: Secondary | ICD-10-CM | POA: Diagnosis not present

## 2022-12-15 DIAGNOSIS — E1142 Type 2 diabetes mellitus with diabetic polyneuropathy: Secondary | ICD-10-CM

## 2022-12-15 DIAGNOSIS — M79675 Pain in left toe(s): Secondary | ICD-10-CM | POA: Diagnosis not present

## 2022-12-15 NOTE — Progress Notes (Signed)
  Subjective:  Patient ID: Evelyn Davis, female    DOB: 1941/09/08,  MRN: [id number]  Chief Complaint  Patient presents with   Nail Problem    Diabetic Foot Care     82 y.o. female presents with the above complaint. History confirmed with patient. Patient presenting with pain related to dystrophic thickened elongated nails. Patient is unable to trim own nails related to nail dystrophy and/or mobility issues. Patient does have a history of T2DM. Patient does take insulin and have neuropathy.  Objective:  Physical Exam: warm, good capillary refill nail exam onychomycosis of the toenails, onycholysis, and dystrophic nails DP pulses palpable, PT pulses palpable, and protective sensation absent Left Foot:  Pain with palpation of nails due to elongation and dystrophic growth.  Right Foot: Pain with palpation of nails due to elongation and dystrophic growth.   Assessment:   1. Pain due to onychomycosis of toenails of both feet   2. Diabetic peripheral neuropathy associated with type 2 diabetes mellitus ([place])      Plan:  Patient was evaluated and treated and all questions answered.  #Onychomycosis with pain  -Nails palliatively debrided as below. -Educated on self-care  Procedure: Nail Debridement Rationale: Pain Type of Debridement: manual, sharp debridement. Instrumentation: Nail nipper, rotary burr. Number of Nails: 10  Return in about 3 months (around 03/17/2023) for [facility].         Everitt Amber, DPM [facility]

## 2023-03-19 ENCOUNTER — Ambulatory Visit: Payer: Medicare Other | Admitting: Podiatry

## 2023-03-19 DIAGNOSIS — B351 Tinea unguium: Secondary | ICD-10-CM

## 2023-03-19 DIAGNOSIS — M79674 Pain in right toe(s): Secondary | ICD-10-CM | POA: Diagnosis not present

## 2023-03-19 DIAGNOSIS — E1142 Type 2 diabetes mellitus with diabetic polyneuropathy: Secondary | ICD-10-CM | POA: Diagnosis not present

## 2023-03-19 DIAGNOSIS — M79675 Pain in left toe(s): Secondary | ICD-10-CM | POA: Diagnosis not present

## 2023-03-19 NOTE — Progress Notes (Signed)
  Subjective:  Patient ID: Evelyn Davis, female    DOB: 09-10-1941,  MRN: [id number]  Chief Complaint  Patient presents with   Nail Problem    Diabetic Foot Care     82 y.o. female presents with the above complaint. History confirmed with patient. Patient presenting with pain related to dystrophic thickened elongated nails. Patient is unable to trim own nails related to nail dystrophy and/or mobility issues. Patient does have a history of T2DM. Patient does take insulin and have neuropathy.  Objective:  Physical Exam: warm, good capillary refill nail exam onychomycosis of the toenails, onycholysis, and dystrophic nails DP pulses palpable, PT pulses palpable, and protective sensation absent Left Foot:  Pain with palpation of nails due to elongation and dystrophic growth.  Right Foot: Pain with palpation of nails due to elongation and dystrophic growth.   Assessment:   1. Pain due to onychomycosis of toenails of both feet   2. DM type 2 with diabetic peripheral neuropathy ([facility])     Plan:  Patient was evaluated and treated and all questions answered.  #Onychomycosis with pain  -Nails palliatively debrided as below. -Educated on self-care  Procedure: Nail Debridement Rationale: Pain Type of Debridement: manual, sharp debridement. Instrumentation: Nail nipper, rotary burr. Number of Nails: 10  No follow-ups on file.         Corinna Gab, DPM [facility]

## 2023-04-18 ENCOUNTER — Other Ambulatory Visit: Payer: Self-pay | Admitting: Internal Medicine

## 2023-04-18 DIAGNOSIS — Z1231 Encounter for screening mammogram for malignant neoplasm of breast: Secondary | ICD-10-CM

## 2023-05-03 DIAGNOSIS — Z23 Encounter for immunization: Secondary | ICD-10-CM | POA: Diagnosis not present

## 2023-05-03 DIAGNOSIS — Z5181 Encounter for therapeutic drug level monitoring: Secondary | ICD-10-CM | POA: Diagnosis not present

## 2023-05-03 DIAGNOSIS — E113293 Type 2 diabetes mellitus with mild nonproliferative diabetic retinopathy without macular edema, bilateral: Secondary | ICD-10-CM | POA: Diagnosis not present

## 2023-05-03 DIAGNOSIS — I10 Essential (primary) hypertension: Secondary | ICD-10-CM | POA: Diagnosis not present

## 2023-05-03 DIAGNOSIS — M6530 Trigger finger, unspecified finger: Secondary | ICD-10-CM | POA: Diagnosis not present

## 2023-05-03 DIAGNOSIS — Z794 Long term (current) use of insulin: Secondary | ICD-10-CM | POA: Diagnosis not present

## 2023-05-03 DIAGNOSIS — E1142 Type 2 diabetes mellitus with diabetic polyneuropathy: Secondary | ICD-10-CM | POA: Diagnosis not present

## 2023-05-03 DIAGNOSIS — E7800 Pure hypercholesterolemia, unspecified: Secondary | ICD-10-CM | POA: Diagnosis not present

## 2023-05-15 ENCOUNTER — Ambulatory Visit: Payer: Medicare Other

## 2023-05-16 ENCOUNTER — Ambulatory Visit
Admission: RE | Admit: 2023-05-16 | Discharge: 2023-05-16 | Disposition: A | Discharge status: Home / Self Care | Payer: Medicare Other | Source: Ambulatory Visit | Attending: Internal Medicine | Admitting: Internal Medicine

## 2023-05-16 DIAGNOSIS — Z1231 Encounter for screening mammogram for malignant neoplasm of breast: Secondary | ICD-10-CM

## 2023-06-19 ENCOUNTER — Ambulatory Visit: Payer: Medicare Other | Admitting: Podiatry

## 2023-07-23 ENCOUNTER — Ambulatory Visit: Payer: Medicare Other | Admitting: Podiatry

## 2023-09-12 DIAGNOSIS — E1142 Type 2 diabetes mellitus with diabetic polyneuropathy: Secondary | ICD-10-CM | POA: Diagnosis not present

## 2023-09-12 DIAGNOSIS — I10 Essential (primary) hypertension: Secondary | ICD-10-CM | POA: Diagnosis not present

## 2023-09-12 DIAGNOSIS — Z9989 Dependence on other enabling machines and devices: Secondary | ICD-10-CM | POA: Diagnosis not present

## 2023-09-12 DIAGNOSIS — G4733 Obstructive sleep apnea (adult) (pediatric): Secondary | ICD-10-CM | POA: Diagnosis not present

## 2023-11-22 DIAGNOSIS — Z0000 Encounter for general adult medical examination without abnormal findings: Secondary | ICD-10-CM | POA: Diagnosis not present

## 2023-11-22 DIAGNOSIS — I6523 Occlusion and stenosis of bilateral carotid arteries: Secondary | ICD-10-CM | POA: Diagnosis not present

## 2023-11-22 DIAGNOSIS — Z5181 Encounter for therapeutic drug level monitoring: Secondary | ICD-10-CM | POA: Diagnosis not present

## 2023-11-22 DIAGNOSIS — E113293 Type 2 diabetes mellitus with mild nonproliferative diabetic retinopathy without macular edema, bilateral: Secondary | ICD-10-CM | POA: Diagnosis not present

## 2023-11-22 DIAGNOSIS — I10 Essential (primary) hypertension: Secondary | ICD-10-CM | POA: Diagnosis not present

## 2023-11-22 DIAGNOSIS — E1142 Type 2 diabetes mellitus with diabetic polyneuropathy: Secondary | ICD-10-CM | POA: Diagnosis not present

## 2023-11-22 DIAGNOSIS — E7800 Pure hypercholesterolemia, unspecified: Secondary | ICD-10-CM | POA: Diagnosis not present

## 2023-11-22 DIAGNOSIS — G4733 Obstructive sleep apnea (adult) (pediatric): Secondary | ICD-10-CM | POA: Diagnosis not present

## 2023-11-22 DIAGNOSIS — Z794 Long term (current) use of insulin: Secondary | ICD-10-CM | POA: Diagnosis not present

## 2024-01-14 DIAGNOSIS — E119 Type 2 diabetes mellitus without complications: Secondary | ICD-10-CM | POA: Diagnosis not present

## 2024-03-03 DIAGNOSIS — B9689 Other specified bacterial agents as the cause of diseases classified elsewhere: Secondary | ICD-10-CM | POA: Diagnosis not present

## 2024-03-03 DIAGNOSIS — R051 Acute cough: Secondary | ICD-10-CM | POA: Diagnosis not present

## 2024-03-03 DIAGNOSIS — J019 Acute sinusitis, unspecified: Secondary | ICD-10-CM | POA: Diagnosis not present

## 2024-03-03 DIAGNOSIS — R0982 Postnasal drip: Secondary | ICD-10-CM | POA: Diagnosis not present

## 2024-03-20 ENCOUNTER — Other Ambulatory Visit: Payer: Self-pay | Admitting: Internal Medicine

## 2024-03-20 DIAGNOSIS — Z1231 Encounter for screening mammogram for malignant neoplasm of breast: Secondary | ICD-10-CM

## 2024-05-08 DIAGNOSIS — E1142 Type 2 diabetes mellitus with diabetic polyneuropathy: Secondary | ICD-10-CM | POA: Diagnosis not present

## 2024-05-08 DIAGNOSIS — E1165 Type 2 diabetes mellitus with hyperglycemia: Secondary | ICD-10-CM | POA: Diagnosis not present

## 2024-05-08 DIAGNOSIS — E7800 Pure hypercholesterolemia, unspecified: Secondary | ICD-10-CM | POA: Diagnosis not present

## 2024-05-16 ENCOUNTER — Ambulatory Visit
Admission: RE | Admit: 2024-05-16 | Discharge: 2024-05-16 | Disposition: A | Discharge status: Home / Self Care | Source: Ambulatory Visit | Attending: Internal Medicine | Admitting: Internal Medicine

## 2024-05-16 DIAGNOSIS — Z1231 Encounter for screening mammogram for malignant neoplasm of breast: Secondary | ICD-10-CM

## 2024-06-08 DIAGNOSIS — E1165 Type 2 diabetes mellitus with hyperglycemia: Secondary | ICD-10-CM | POA: Diagnosis not present

## 2024-06-08 DIAGNOSIS — E7800 Pure hypercholesterolemia, unspecified: Secondary | ICD-10-CM | POA: Diagnosis not present

## 2024-06-08 DIAGNOSIS — E1142 Type 2 diabetes mellitus with diabetic polyneuropathy: Secondary | ICD-10-CM | POA: Diagnosis not present

## 2024-07-08 DIAGNOSIS — E1142 Type 2 diabetes mellitus with diabetic polyneuropathy: Secondary | ICD-10-CM | POA: Diagnosis not present

## 2024-07-08 DIAGNOSIS — E7800 Pure hypercholesterolemia, unspecified: Secondary | ICD-10-CM | POA: Diagnosis not present

## 2024-07-08 DIAGNOSIS — E1165 Type 2 diabetes mellitus with hyperglycemia: Secondary | ICD-10-CM | POA: Diagnosis not present
